# Patient Record
Sex: Female | Born: 1955 | Race: White | Hispanic: No | Smoking: Former smoker
Health system: Southern US, Community
[De-identification: ages and names within clinical notes are randomized; demographics above are authoritative.]

## PROBLEM LIST (undated history)

## (undated) DIAGNOSIS — Z9889 Other specified postprocedural states: Secondary | ICD-10-CM

## (undated) DIAGNOSIS — Z923 Personal history of irradiation: Secondary | ICD-10-CM

## (undated) DIAGNOSIS — F419 Anxiety disorder, unspecified: Secondary | ICD-10-CM

## (undated) DIAGNOSIS — C801 Malignant (primary) neoplasm, unspecified: Secondary | ICD-10-CM

## (undated) DIAGNOSIS — E039 Hypothyroidism, unspecified: Secondary | ICD-10-CM

## (undated) DIAGNOSIS — K219 Gastro-esophageal reflux disease without esophagitis: Secondary | ICD-10-CM

## (undated) DIAGNOSIS — M1990 Unspecified osteoarthritis, unspecified site: Secondary | ICD-10-CM

## (undated) DIAGNOSIS — C50919 Malignant neoplasm of unspecified site of unspecified female breast: Secondary | ICD-10-CM

## (undated) DIAGNOSIS — T7840XA Allergy, unspecified, initial encounter: Secondary | ICD-10-CM

## (undated) DIAGNOSIS — L409 Psoriasis, unspecified: Secondary | ICD-10-CM

## (undated) HISTORY — DX: Unspecified osteoarthritis, unspecified site: M19.90

## (undated) HISTORY — PX: TUBAL LIGATION: SHX77

## (undated) HISTORY — DX: Other specified postprocedural states: Z98.890

## (undated) HISTORY — PX: TONSILLECTOMY: SUR1361

## (undated) HISTORY — DX: Allergy, unspecified, initial encounter: T78.40XA

## (undated) HISTORY — DX: Hypothyroidism, unspecified: E03.9

## (undated) HISTORY — PX: COLONOSCOPY: SHX174

## (undated) HISTORY — DX: Gastro-esophageal reflux disease without esophagitis: K21.9

## (undated) HISTORY — PX: BREAST LUMPECTOMY: SHX2

## (undated) HISTORY — DX: Psoriasis, unspecified: L40.9

## (undated) HISTORY — PX: EYE SURGERY: SHX253

## (undated) HISTORY — DX: Malignant (primary) neoplasm, unspecified: C80.1

## (undated) HISTORY — DX: Personal history of irradiation: Z92.3

## (undated) HISTORY — PX: POLYPECTOMY: SHX149

---

## 1998-02-27 ENCOUNTER — Ambulatory Visit (HOSPITAL_COMMUNITY): Admission: RE | Admit: 1998-02-27 | Discharge: 1998-02-27 | Payer: Self-pay | Admitting: Obstetrics and Gynecology

## 1999-05-19 ENCOUNTER — Encounter: Admission: RE | Admit: 1999-05-19 | Discharge: 1999-05-19 | Payer: Self-pay | Admitting: Obstetrics and Gynecology

## 1999-05-19 ENCOUNTER — Encounter: Payer: Self-pay | Admitting: Obstetrics and Gynecology

## 2000-05-19 ENCOUNTER — Encounter: Admission: RE | Admit: 2000-05-19 | Discharge: 2000-05-19 | Payer: Self-pay | Admitting: Obstetrics and Gynecology

## 2000-05-19 ENCOUNTER — Encounter: Payer: Self-pay | Admitting: Obstetrics and Gynecology

## 2001-05-22 ENCOUNTER — Encounter: Admission: RE | Admit: 2001-05-22 | Discharge: 2001-05-22 | Payer: Self-pay | Admitting: Internal Medicine

## 2001-05-22 ENCOUNTER — Encounter: Payer: Self-pay | Admitting: Internal Medicine

## 2002-03-01 ENCOUNTER — Ambulatory Visit (HOSPITAL_BASED_OUTPATIENT_CLINIC_OR_DEPARTMENT_OTHER): Admission: RE | Admit: 2002-03-01 | Discharge: 2002-03-01 | Payer: Self-pay | Admitting: Obstetrics and Gynecology

## 2002-06-12 ENCOUNTER — Encounter: Admission: RE | Admit: 2002-06-12 | Discharge: 2002-06-12 | Payer: Self-pay | Admitting: Obstetrics and Gynecology

## 2002-06-12 ENCOUNTER — Encounter: Payer: Self-pay | Admitting: Obstetrics and Gynecology

## 2002-06-13 ENCOUNTER — Encounter: Admission: RE | Admit: 2002-06-13 | Discharge: 2002-06-13 | Payer: Self-pay | Admitting: Obstetrics and Gynecology

## 2002-06-13 ENCOUNTER — Encounter: Payer: Self-pay | Admitting: Obstetrics and Gynecology

## 2003-06-25 ENCOUNTER — Encounter: Admission: RE | Admit: 2003-06-25 | Discharge: 2003-06-25 | Payer: Self-pay | Admitting: Obstetrics and Gynecology

## 2004-06-30 ENCOUNTER — Encounter: Admission: RE | Admit: 2004-06-30 | Discharge: 2004-06-30 | Payer: Self-pay | Admitting: Obstetrics and Gynecology

## 2004-07-02 ENCOUNTER — Encounter: Admission: RE | Admit: 2004-07-02 | Discharge: 2004-07-02 | Payer: Self-pay | Admitting: Obstetrics and Gynecology

## 2004-10-05 ENCOUNTER — Ambulatory Visit: Payer: Self-pay | Admitting: Internal Medicine

## 2004-10-09 ENCOUNTER — Ambulatory Visit: Payer: Self-pay | Admitting: Internal Medicine

## 2004-12-30 ENCOUNTER — Ambulatory Visit: Payer: Self-pay | Admitting: Hematology & Oncology

## 2005-04-13 ENCOUNTER — Encounter: Admission: RE | Admit: 2005-04-13 | Discharge: 2005-04-13 | Payer: Self-pay | Admitting: Obstetrics and Gynecology

## 2005-07-13 ENCOUNTER — Other Ambulatory Visit: Admission: RE | Admit: 2005-07-13 | Discharge: 2005-07-13 | Payer: Self-pay | Admitting: Obstetrics and Gynecology

## 2006-04-14 ENCOUNTER — Encounter: Admission: RE | Admit: 2006-04-14 | Discharge: 2006-04-14 | Payer: Self-pay | Admitting: Internal Medicine

## 2006-07-18 ENCOUNTER — Other Ambulatory Visit: Admission: RE | Admit: 2006-07-18 | Discharge: 2006-07-18 | Payer: Self-pay | Admitting: Obstetrics and Gynecology

## 2007-04-20 ENCOUNTER — Encounter: Admission: RE | Admit: 2007-04-20 | Discharge: 2007-04-20 | Payer: Self-pay | Admitting: Internal Medicine

## 2007-08-17 ENCOUNTER — Other Ambulatory Visit: Admission: RE | Admit: 2007-08-17 | Discharge: 2007-08-17 | Payer: Self-pay | Admitting: Obstetrics and Gynecology

## 2008-04-22 ENCOUNTER — Encounter: Admission: RE | Admit: 2008-04-22 | Discharge: 2008-04-22 | Payer: Self-pay | Admitting: Internal Medicine

## 2008-07-29 ENCOUNTER — Other Ambulatory Visit: Admission: RE | Admit: 2008-07-29 | Discharge: 2008-07-29 | Payer: Self-pay | Admitting: Obstetrics and Gynecology

## 2008-12-11 ENCOUNTER — Encounter: Admission: RE | Admit: 2008-12-11 | Discharge: 2008-12-11 | Payer: Self-pay | Admitting: Neurosurgery

## 2009-04-24 ENCOUNTER — Encounter: Admission: RE | Admit: 2009-04-24 | Discharge: 2009-04-24 | Payer: Self-pay | Admitting: Internal Medicine

## 2009-05-06 ENCOUNTER — Encounter: Admission: RE | Admit: 2009-05-06 | Discharge: 2009-05-06 | Payer: Self-pay | Admitting: Internal Medicine

## 2009-10-28 ENCOUNTER — Encounter: Admission: RE | Admit: 2009-10-28 | Discharge: 2009-10-28 | Payer: Self-pay | Admitting: Internal Medicine

## 2010-04-13 ENCOUNTER — Encounter: Admission: RE | Admit: 2010-04-13 | Discharge: 2010-04-13 | Payer: Self-pay | Admitting: Internal Medicine

## 2010-08-03 ENCOUNTER — Other Ambulatory Visit: Payer: Self-pay | Admitting: Internal Medicine

## 2010-08-03 DIAGNOSIS — N6310 Unspecified lump in the right breast, unspecified quadrant: Secondary | ICD-10-CM

## 2010-10-26 ENCOUNTER — Other Ambulatory Visit (HOSPITAL_COMMUNITY)
Admission: RE | Admit: 2010-10-26 | Discharge: 2010-10-26 | Disposition: A | Discharge status: Home / Self Care | Payer: BC Managed Care – PPO | Source: Ambulatory Visit | Attending: Obstetrics and Gynecology | Admitting: Obstetrics and Gynecology

## 2010-10-26 ENCOUNTER — Other Ambulatory Visit: Payer: Self-pay | Admitting: Obstetrics and Gynecology

## 2010-10-26 DIAGNOSIS — Z01419 Encounter for gynecological examination (general) (routine) without abnormal findings: Secondary | ICD-10-CM | POA: Insufficient documentation

## 2010-11-20 NOTE — Op Note (Signed)
   TNAMEGREY, SCHLAUCH                        ACCOUNT NO.:  [id number]   MEDICAL RECORD NO.:  [id number]                   PATIENT TYPE:  AMB   LOCATION:  [facility]                                 FACILITY:  [facility]   PHYSICIAN:  Katherine Roan, M.D.               DATE OF BIRTH:  07/06/1955   DATE OF PROCEDURE:  03/01/2002  DATE OF DISCHARGE:  03/01/2002                                 OPERATIVE REPORT   PREOPERATIVE DIAGNOSIS:  Desires sterilization.   POSTOPERATIVE DIAGNOSIS:  Desires sterilization.  Small uterine fibroids.   OPERATION PERFORMED:  Laparoscopic tubal ligation.   DESCRIPTION OF PROCEDURE:  The patient was placed in the lithotomy position  and the patient then examined under anesthesia.  Found to have an irregular,  normal-sized uterus.  No adnexal masses were noted.  The liver was not  enlarged.  The bladder was emptied.  Preoperatively draped in the usual  fashion.  A transverse incision was made in the abdomen, and the abdomen was  distended with the Veress needle using aspiration infusion technique.  The  abdomen was distended with carbon dioxide to about 3 L, and then the trocar  was inserted into the abdomen.  Visualization of the uterus revealed a  fundal fibroid and two small lower segment seedling fibroids.  Both ovaries  were normal.  A second puncture was made in the midline, and the tubes were  then cauterized 2 cm lateral to the uterotubal junction and scissored.  Hemostasis was secure.  Gas was evacuated and the umbilical incision was  closed with 0 Vicryl and 4-0 PDS.  The lower incision was closed with 4-0  PDS.  The incisions were infiltrated with about 17 cc of 0.5% Marcaine with  epinephrine.  The patient tolerated the procedure well and was sent to the  recovery room in good condition.                                               Katherine Roan, M.D.    SDM/MEDQ  D:  03/01/2002  T:  03/03/2002  Job:  [id number]

## 2011-02-10 ENCOUNTER — Telehealth: Payer: Self-pay | Admitting: *Deleted

## 2011-02-10 NOTE — Telephone Encounter (Signed)
Called patient at home no answer.  Called her mobile and she states she will call and schedule.  She wasn't at a place where she could look at her schedule.

## 2011-04-12 ENCOUNTER — Other Ambulatory Visit: Payer: Self-pay | Admitting: Internal Medicine

## 2011-04-12 ENCOUNTER — Ambulatory Visit
Admission: RE | Admit: 2011-04-12 | Discharge: 2011-04-12 | Disposition: A | Discharge status: Home / Self Care | Payer: BC Managed Care – PPO | Source: Ambulatory Visit | Attending: Internal Medicine | Admitting: Internal Medicine

## 2011-04-12 DIAGNOSIS — N6310 Unspecified lump in the right breast, unspecified quadrant: Secondary | ICD-10-CM

## 2011-04-13 ENCOUNTER — Other Ambulatory Visit: Payer: Self-pay | Admitting: Internal Medicine

## 2011-04-13 DIAGNOSIS — C50911 Malignant neoplasm of unspecified site of right female breast: Secondary | ICD-10-CM

## 2011-04-16 ENCOUNTER — Ambulatory Visit
Admission: RE | Admit: 2011-04-16 | Discharge: 2011-04-16 | Disposition: A | Discharge status: Home / Self Care | Payer: BC Managed Care – PPO | Source: Ambulatory Visit | Attending: Internal Medicine | Admitting: Internal Medicine

## 2011-04-16 DIAGNOSIS — C50911 Malignant neoplasm of unspecified site of right female breast: Secondary | ICD-10-CM

## 2011-04-16 MED ORDER — GADOBENATE DIMEGLUMINE 529 MG/ML IV SOLN: 20.0000 mL | Freq: Once | INTRAVENOUS | Status: AC | PRN

## 2011-04-21 ENCOUNTER — Other Ambulatory Visit: Payer: Self-pay | Admitting: Oncology

## 2011-04-21 ENCOUNTER — Encounter (INDEPENDENT_AMBULATORY_CARE_PROVIDER_SITE_OTHER): Payer: Self-pay | Admitting: Surgery

## 2011-04-21 ENCOUNTER — Ambulatory Visit (HOSPITAL_BASED_OUTPATIENT_CLINIC_OR_DEPARTMENT_OTHER): Payer: BC Managed Care – PPO | Admitting: Surgery

## 2011-04-21 ENCOUNTER — Ambulatory Visit: Payer: BC Managed Care – PPO | Attending: Surgery | Admitting: Physical Therapy

## 2011-04-21 ENCOUNTER — Encounter (HOSPITAL_BASED_OUTPATIENT_CLINIC_OR_DEPARTMENT_OTHER): Payer: BC Managed Care – PPO | Admitting: Oncology

## 2011-04-21 VITALS — BP 133/65 | HR 67 | Temp 98.6°F | Resp 20 | Ht 64.0 in | Wt 220.9 lb

## 2011-04-21 DIAGNOSIS — C50919 Malignant neoplasm of unspecified site of unspecified female breast: Secondary | ICD-10-CM | POA: Insufficient documentation

## 2011-04-21 DIAGNOSIS — M25619 Stiffness of unspecified shoulder, not elsewhere classified: Secondary | ICD-10-CM | POA: Insufficient documentation

## 2011-04-21 DIAGNOSIS — C50911 Malignant neoplasm of unspecified site of right female breast: Secondary | ICD-10-CM

## 2011-04-21 DIAGNOSIS — C50419 Malignant neoplasm of upper-outer quadrant of unspecified female breast: Secondary | ICD-10-CM

## 2011-04-21 DIAGNOSIS — IMO0001 Reserved for inherently not codable concepts without codable children: Secondary | ICD-10-CM | POA: Insufficient documentation

## 2011-04-21 LAB — CBC WITH DIFFERENTIAL/PLATELET
BASO%: 0.2 % (ref 0.0–2.0)
Basophils Absolute: 0 10*3/uL (ref 0.0–0.1)
EOS%: 3.5 % (ref 0.0–7.0)
Eosinophils Absolute: 0.3 10*3/uL (ref 0.0–0.5)
HCT: 39.7 % (ref 34.8–46.6)
HGB: 13.6 g/dL (ref 11.6–15.9)
LYMPH%: 23.9 % (ref 14.0–49.7)
MCH: 30.6 pg (ref 25.1–34.0)
MCHC: 34.4 g/dL (ref 31.5–36.0)
MCV: 89.1 fL (ref 79.5–101.0)
MONO#: 0.8 10*3/uL (ref 0.1–0.9)
MONO%: 8.5 % (ref 0.0–14.0)
NEUT#: 6 10*3/uL (ref 1.5–6.5)
NEUT%: 63.9 % (ref 38.4–76.8)
Platelets: 276 10*3/uL (ref 145–400)
RBC: 4.45 10*6/uL (ref 3.70–5.45)
RDW: 12.7 % (ref 11.2–14.5)
WBC: 9.4 10*3/uL (ref 3.9–10.3)
lymph#: 2.3 10*3/uL (ref 0.9–3.3)

## 2011-04-21 LAB — COMPREHENSIVE METABOLIC PANEL
ALT: 15 U/L (ref 0–35)
AST: 18 U/L (ref 0–37)
Albumin: 3.4 g/dL — ABNORMAL LOW (ref 3.5–5.2)
BUN: 15 mg/dL (ref 6–23)
Calcium: 9.5 mg/dL (ref 8.4–10.5)
Chloride: 100 mEq/L (ref 96–112)
Potassium: 4.3 mEq/L (ref 3.5–5.3)
Total Protein: 7.1 g/dL (ref 6.0–8.3)

## 2011-04-21 NOTE — Progress Notes (Signed)
Chief Complaint  Patient presents with  . Breast Cancer    HPI Kristen Gibson is a 55 y.o. female.  This is a pleasant lady referred by Dr. Timothy Lasso for evaluation and a recent right breast mass.biopsy of this mass is confirmed invasive breast cancer. She has no other complaints. She denies any previous problems with her breasts or nipple discharge. HPI  Past Medical History  Diagnosis Date  . Arthritis     Past Surgical History  Procedure Date  . Tubal ligation   . Appendectomy     Family History  Problem Relation Age of Onset  . Cancer Mother   . Cancer Maternal Grandmother   . Cancer Paternal Grandmother     Social History History  Substance Use Topics  . Smoking status: Never Smoker   . Smokeless tobacco: Not on file  . Alcohol Use: No    No Known Allergies  No current outpatient prescriptions on file.    Review of Systems Review of Systems  Constitutional: Negative.   HENT: Negative.   Eyes: Negative.   Respiratory: Negative.   Cardiovascular: Negative.   Gastrointestinal: Negative.   Genitourinary: Negative.   Musculoskeletal: Negative.   Neurological: Negative.   Hematological: Negative.   Psychiatric/Behavioral: Negative.     Blood pressure 133/65, pulse 67, temperature 98.6 F (37 C), resp. rate 20, height 5\' 4"  (1.626 m), weight 220 lb 14.4 oz (100.2 kg).  Physical Exam Physical Exam  Constitutional: She is oriented to person, place, and time. She appears well-developed and well-nourished. No distress.  HENT:  Head: Normocephalic and atraumatic.  Right Ear: External ear normal.  Left Ear: External ear normal.  Nose: Nose normal.  Mouth/Throat: Oropharynx is clear and moist. No oropharyngeal exudate.  Eyes: Conjunctivae are normal. Pupils are equal, round, and reactive to light. No scleral icterus.  Neck: Normal range of motion. Neck supple. No tracheal deviation present. No thyromegaly present.  Cardiovascular: Normal rate, regular rhythm,  normal heart sounds and intact distal pulses.   No murmur heard. Pulmonary/Chest: Effort normal and breath sounds normal. No respiratory distress. She has no wheezes.  Abdominal: Soft. Bowel sounds are normal. She exhibits no distension. There is no tenderness.  Musculoskeletal: Normal range of motion. She exhibits no edema and no tenderness.  Lymphadenopathy:    She has no cervical adenopathy.    She has no axillary adenopathy.       Right axillary: No lateral adenopathy present.       Left axillary: No lateral adenopathy present. Neurological: She is alert and oriented to person, place, and time.  Skin: Skin is warm and dry. No rash noted. No erythema.  Psychiatric: Her behavior is normal. Judgment normal.  there are no palpable masses in either breast other than a hematoma from previous biopsy of the right breast.  Data Reviewed The patient's MRI, mammogram, and biopsy results were reviewed.  Assessment    Patient with invasive right breast cancer    Plan    After discussion in the clinic, needle localized lumpectomy and sentinel lymph node biopsy were recommended.  I discussed this with her in detail. She is eating to proceed with breast conservation. I discussed the surgical procedure in detail. I discussed the risks of surgery which includes but is not limited to bleeding, infection, injury to other structures, need for further surgery should the nodes be positive for margins positive, et Karie Soda. Likely the success. She wished to proceed with surgery  Cait Locust A 04/21/2011, 10:35 AM    and

## 2011-04-23 ENCOUNTER — Other Ambulatory Visit (INDEPENDENT_AMBULATORY_CARE_PROVIDER_SITE_OTHER): Payer: Self-pay | Admitting: Surgery

## 2011-04-23 DIAGNOSIS — C50919 Malignant neoplasm of unspecified site of unspecified female breast: Secondary | ICD-10-CM

## 2011-04-23 DIAGNOSIS — C50911 Malignant neoplasm of unspecified site of right female breast: Secondary | ICD-10-CM

## 2011-04-30 ENCOUNTER — Encounter (INDEPENDENT_AMBULATORY_CARE_PROVIDER_SITE_OTHER): Payer: BC Managed Care – PPO | Admitting: Surgery

## 2011-05-06 DIAGNOSIS — C801 Malignant (primary) neoplasm, unspecified: Secondary | ICD-10-CM

## 2011-05-06 HISTORY — DX: Malignant (primary) neoplasm, unspecified: C80.1

## 2011-05-10 ENCOUNTER — Encounter (HOSPITAL_COMMUNITY): Payer: Self-pay

## 2011-05-10 ENCOUNTER — Other Ambulatory Visit (HOSPITAL_COMMUNITY): Payer: BC Managed Care – PPO

## 2011-05-10 ENCOUNTER — Encounter (HOSPITAL_COMMUNITY)
Admission: RE | Admit: 2011-05-10 | Discharge: 2011-05-10 | Disposition: A | Discharge status: Home / Self Care | Payer: BC Managed Care – PPO | Source: Ambulatory Visit | Attending: Surgery | Admitting: Surgery

## 2011-05-10 ENCOUNTER — Encounter (HOSPITAL_COMMUNITY): Payer: Self-pay | Admitting: Pharmacy Technician

## 2011-05-10 LAB — BASIC METABOLIC PANEL
BUN: 13 mg/dL (ref 6–23)
Calcium: 9.4 mg/dL (ref 8.4–10.5)
GFR calc Af Amer: >90 mL/min (ref >90)
GFR calc non Af Amer: >90 mL/min (ref >90)
Glucose, Bld: 93 mg/dL (ref 70–99)
Sodium: 140 mEq/L (ref 135–145)

## 2011-05-10 LAB — SURGICAL PCR SCREEN: Staphylococcus aureus: NEGATIVE

## 2011-05-10 LAB — CBC
Hemoglobin: 13.9 g/dL (ref 12.0–15.0)
MCH: 30.5 pg (ref 26.0–34.0)
MCHC: 34.5 g/dL (ref 30.0–36.0)
RDW: 12.8 % (ref 11.5–15.5)

## 2011-05-10 NOTE — Pre-Procedure Instructions (Signed)
20 Kristen Gibson  05/10/2011   Your procedure is scheduled on:  05/13/11  Report to [name] at 1145 AM.  Call this number if you have problems the morning of surgery: [phone]   Remember:   Do not eat food:After Midnight.  Do not drink clear liquids: 4 Hours before arrival.  Take these medicines the morning of surgery with A SIP OF WATER: synthroid,   Do not wear jewelry, make-up or nail polish.  Do not wear lotions, powders, or perfumes. You may wear deodorant.  Do not shave 48 hours prior to surgery.  Do not bring valuables to the hospital.  Contacts, dentures or bridgework may not be worn into surgery.  Leave suitcase in the car. After surgery it may be brought to your room.  For patients admitted to the hospital, checkout time is 11:00 AM the day of discharge.   Patients discharged the day of surgery will not be allowed to drive home.  Name and phone number of your driver: family  Special Instructions: CHG Shower Use Special Wash: 1/2 bottle night before surgery and 1/2 bottle morning of surgery.   Please read over the following fact sheets that you were given: MRSA Information

## 2011-05-12 MED ORDER — CEFAZOLIN SODIUM-DEXTROSE 2-3 GM-% IV SOLR
2.0000 g | INTRAVENOUS | Status: DC
  Filled 2011-05-12: qty 50

## 2011-05-13 ENCOUNTER — Ambulatory Visit (HOSPITAL_COMMUNITY): Payer: BC Managed Care – PPO | Admitting: Anesthesiology

## 2011-05-13 ENCOUNTER — Encounter: Payer: Self-pay | Admitting: Internal Medicine

## 2011-05-13 ENCOUNTER — Encounter (HOSPITAL_COMMUNITY): Payer: Self-pay | Admitting: Anesthesiology

## 2011-05-13 ENCOUNTER — Ambulatory Visit (HOSPITAL_COMMUNITY)
Admission: RE | Admit: 2011-05-13 | Discharge: 2011-05-13 | Disposition: A | Discharge status: Home / Self Care | Payer: BC Managed Care – PPO | Source: Ambulatory Visit | Attending: Surgery | Admitting: Surgery

## 2011-05-13 ENCOUNTER — Other Ambulatory Visit (INDEPENDENT_AMBULATORY_CARE_PROVIDER_SITE_OTHER): Payer: Self-pay | Admitting: Surgery

## 2011-05-13 ENCOUNTER — Ambulatory Visit
Admission: RE | Admit: 2011-05-13 | Discharge: 2011-05-13 | Disposition: A | Discharge status: Home / Self Care | Payer: BC Managed Care – PPO | Source: Ambulatory Visit | Attending: Surgery | Admitting: Surgery

## 2011-05-13 ENCOUNTER — Encounter (HOSPITAL_COMMUNITY): Payer: Self-pay | Admitting: Surgery

## 2011-05-13 ENCOUNTER — Other Ambulatory Visit (HOSPITAL_COMMUNITY): Payer: BC Managed Care – PPO

## 2011-05-13 ENCOUNTER — Encounter (HOSPITAL_COMMUNITY)
Admission: RE | Disposition: A | Discharge status: Home / Self Care | Payer: Self-pay | Source: Ambulatory Visit | Attending: Surgery

## 2011-05-13 DIAGNOSIS — C50919 Malignant neoplasm of unspecified site of unspecified female breast: Secondary | ICD-10-CM

## 2011-05-13 DIAGNOSIS — C50419 Malignant neoplasm of upper-outer quadrant of unspecified female breast: Secondary | ICD-10-CM | POA: Insufficient documentation

## 2011-05-13 DIAGNOSIS — Z01812 Encounter for preprocedural laboratory examination: Secondary | ICD-10-CM | POA: Insufficient documentation

## 2011-05-13 DIAGNOSIS — E039 Hypothyroidism, unspecified: Secondary | ICD-10-CM | POA: Insufficient documentation

## 2011-05-13 DIAGNOSIS — C50911 Malignant neoplasm of unspecified site of right female breast: Secondary | ICD-10-CM

## 2011-05-13 DIAGNOSIS — D0590 Unspecified type of carcinoma in situ of unspecified breast: Secondary | ICD-10-CM | POA: Insufficient documentation

## 2011-05-13 HISTORY — PX: BREAST SURGERY: SHX581

## 2011-05-13 SURGERY — BREAST LUMPECTOMY WITH SENTINEL LYMPH NODE BX
Anesthesia: General | Site: Breast | Laterality: Right | Wound class: Clean

## 2011-05-13 SURGERY — Surgical Case
Anesthesia: *Unknown

## 2011-05-13 MED ORDER — TECHNETIUM TC 99M SULFUR COLLOID FILTERED
1.0000 | Freq: Once | INTRAVENOUS | Status: AC | PRN
  Administered 2011-05-13: 1 via INTRADERMAL

## 2011-05-13 MED ORDER — SODIUM CHLORIDE 0.9 % IJ SOLN: 3.0000 mL | INTRAMUSCULAR | Status: DC | PRN

## 2011-05-13 MED ORDER — MIDAZOLAM HCL 2 MG/2ML IJ SOLN: 0.5000 mg | Freq: Once | INTRAMUSCULAR | Status: AC | PRN

## 2011-05-13 MED ORDER — HYDROCODONE-ACETAMINOPHEN 5-325 MG PO TABS: 1.0000 | ORAL_TABLET | Freq: Four times a day (QID) | ORAL | Status: AC | PRN

## 2011-05-13 MED ORDER — PROMETHAZINE HCL 25 MG/ML IJ SOLN: 6.2500 mg | INTRAMUSCULAR | Status: DC | PRN

## 2011-05-13 MED ORDER — FENTANYL CITRATE 0.05 MG/ML IJ SOLN
INTRAMUSCULAR | Status: DC | PRN
  Administered 2011-05-13 (×5): 50 ug via INTRAVENOUS

## 2011-05-13 MED ORDER — ACETAMINOPHEN 325 MG PO TABS: 650.0000 mg | ORAL_TABLET | ORAL | Status: DC | PRN

## 2011-05-13 MED ORDER — ACETAMINOPHEN 650 MG RE SUPP: 650.0000 mg | RECTAL | Status: DC | PRN

## 2011-05-13 MED ORDER — HYDROMORPHONE HCL PF 1 MG/ML IJ SOLN
0.2500 mg | INTRAMUSCULAR | Status: DC | PRN
  Administered 2011-05-13 (×2): 0.25 mg via INTRAVENOUS

## 2011-05-13 MED ORDER — OXYCODONE HCL 5 MG PO TABS: 5.0000 mg | ORAL_TABLET | ORAL | Status: DC | PRN

## 2011-05-13 MED ORDER — METHYLENE BLUE 1 % INJ SOLN
INTRAMUSCULAR | Status: DC | PRN
  Administered 2011-05-13: 5 mL

## 2011-05-13 MED ORDER — MEPERIDINE HCL 25 MG/ML IJ SOLN: 6.2500 mg | INTRAMUSCULAR | Status: DC | PRN

## 2011-05-13 MED ORDER — MORPHINE SULFATE 10 MG/ML IJ SOLN
INTRAMUSCULAR | Status: DC | PRN
  Administered 2011-05-13 (×2): 5 mg via INTRAVENOUS

## 2011-05-13 MED ORDER — DEXAMETHASONE SODIUM PHOSPHATE 4 MG/ML IJ SOLN
INTRAMUSCULAR | Status: DC | PRN
  Administered 2011-05-13: 4 mg via INTRAVENOUS

## 2011-05-13 MED ORDER — PROPOFOL 10 MG/ML IV EMUL
INTRAVENOUS | Status: DC | PRN
  Administered 2011-05-13: 160 mg via INTRAVENOUS

## 2011-05-13 MED ORDER — SODIUM CHLORIDE 0.9 % IR SOLN
Status: DC | PRN
  Administered 2011-05-13: 1

## 2011-05-13 MED ORDER — SODIUM CHLORIDE 0.9 % IJ SOLN: 3.0000 mL | Freq: Two times a day (BID) | INTRAMUSCULAR | Status: DC

## 2011-05-13 MED ORDER — ONDANSETRON HCL 4 MG/2ML IJ SOLN
INTRAMUSCULAR | Status: DC | PRN
  Administered 2011-05-13 (×2): 4 mg via INTRAVENOUS

## 2011-05-13 MED ORDER — MORPHINE SULFATE 2 MG/ML IJ SOLN: 2.0000 mg | INTRAMUSCULAR | Status: DC | PRN

## 2011-05-13 MED ORDER — BUPIVACAINE-EPINEPHRINE 0.25% -1:200000 IJ SOLN
INTRAMUSCULAR | Status: DC | PRN
  Administered 2011-05-13: 20 mL

## 2011-05-13 MED ORDER — MIDAZOLAM HCL 5 MG/5ML IJ SOLN
INTRAMUSCULAR | Status: DC | PRN
  Administered 2011-05-13: 2 mg via INTRAVENOUS

## 2011-05-13 MED ORDER — PROMETHAZINE HCL 25 MG/ML IJ SOLN: 12.5000 mg | Freq: Four times a day (QID) | INTRAMUSCULAR | Status: DC | PRN

## 2011-05-13 MED ORDER — SODIUM CHLORIDE 0.9 % IV SOLN: 250.0000 mL | INTRAVENOUS | Status: DC

## 2011-05-13 MED ORDER — MORPHINE SULFATE 2 MG/ML IJ SOLN: 0.0500 mg/kg | INTRAMUSCULAR | Status: DC | PRN

## 2011-05-13 MED ORDER — ONDANSETRON HCL 4 MG/2ML IJ SOLN: 4.0000 mg | Freq: Four times a day (QID) | INTRAMUSCULAR | Status: DC | PRN

## 2011-05-13 MED ORDER — LACTATED RINGERS IV SOLN
INTRAVENOUS | Status: DC | PRN
  Administered 2011-05-13 (×2): via INTRAVENOUS

## 2011-05-13 MED ORDER — KETOROLAC TROMETHAMINE 30 MG/ML IJ SOLN
INTRAMUSCULAR | Status: DC | PRN
  Administered 2011-05-13: 30 mg via INTRAVENOUS

## 2011-05-13 SURGICAL SUPPLY — 44 items
APPLIER CLIP 9.375 MED OPEN (MISCELLANEOUS) ×2
BENZOIN TINCTURE PRP APPL 2/3 (GAUZE/BANDAGES/DRESSINGS) ×2 IMPLANT
BLADE SURG 15 STRL LF DISP TIS (BLADE) ×1 IMPLANT
BLADE SURG 15 STRL SS (BLADE) ×1
CANISTER SUCTION 2500CC (MISCELLANEOUS) ×2 IMPLANT
CHLORAPREP W/TINT 26ML (MISCELLANEOUS) ×2 IMPLANT
CLIP APPLIE 9.375 MED OPEN (MISCELLANEOUS) ×1 IMPLANT
CLOTH BEACON ORANGE TIMEOUT ST (SAFETY) ×2 IMPLANT
CONT SPEC 4OZ CLIKSEAL STRL BL (MISCELLANEOUS) ×2 IMPLANT
COVER PROBE W GEL 5X96 (DRAPES) ×2 IMPLANT
COVER SURGICAL LIGHT HANDLE (MISCELLANEOUS) ×2 IMPLANT
DEVICE DUBIN SPECIMEN MAMMOGRA (MISCELLANEOUS) ×2 IMPLANT
DRAPE LAPAROSCOPIC ABDOMINAL (DRAPES) ×2 IMPLANT
DRSG TEGADERM 4X4.75 (GAUZE/BANDAGES/DRESSINGS) ×2 IMPLANT
ELECT CAUTERY BLADE 6.4 (BLADE) ×2 IMPLANT
ELECT REM PT RETURN 9FT ADLT (ELECTROSURGICAL) ×2
ELECTRODE REM PT RTRN 9FT ADLT (ELECTROSURGICAL) ×1 IMPLANT
GAUZE SPONGE 4X4 12PLY STRL LF (GAUZE/BANDAGES/DRESSINGS) ×2 IMPLANT
GLOVE SURG SIGNA 7.5 PF LTX (GLOVE) ×2 IMPLANT
GOWN PREVENTION PLUS XLARGE (GOWN DISPOSABLE) ×2 IMPLANT
GOWN STRL NON-REIN LRG LVL3 (GOWN DISPOSABLE) ×2 IMPLANT
KIT BASIN OR (CUSTOM PROCEDURE TRAY) ×2 IMPLANT
KIT MARKER MARGIN INK (KITS) ×2 IMPLANT
KIT ROOM TURNOVER OR (KITS) ×2 IMPLANT
NEEDLE 18GX1X1/2 (RX/OR ONLY) (NEEDLE) ×2 IMPLANT
NEEDLE HYPO 25GX1X1/2 BEV (NEEDLE) ×4 IMPLANT
NS IRRIG 1000ML POUR BTL (IV SOLUTION) ×2 IMPLANT
PACK SURGICAL SETUP 50X90 (CUSTOM PROCEDURE TRAY) ×2 IMPLANT
PAD ARMBOARD 7.5X6 YLW CONV (MISCELLANEOUS) ×2 IMPLANT
PENCIL BUTTON HOLSTER BLD 10FT (ELECTRODE) ×2 IMPLANT
SPONGE GAUZE 4X4 12PLY (GAUZE/BANDAGES/DRESSINGS) ×2 IMPLANT
SPONGE LAP 4X18 X RAY DECT (DISPOSABLE) ×2 IMPLANT
STRIP CLOSURE SKIN 1/2X4 (GAUZE/BANDAGES/DRESSINGS) ×2 IMPLANT
SUT MON AB 4-0 PC3 18 (SUTURE) ×4 IMPLANT
SUT SILK 2 0 SH (SUTURE) IMPLANT
SUT VIC AB 3-0 SH 27 (SUTURE) ×1
SUT VIC AB 3-0 SH 27XBRD (SUTURE) ×1 IMPLANT
SYR BULB 3OZ (MISCELLANEOUS) ×2 IMPLANT
SYR CONTROL 10ML LL (SYRINGE) ×4 IMPLANT
TOWEL OR 17X24 6PK STRL BLUE (TOWEL DISPOSABLE) ×2 IMPLANT
TOWEL OR 17X26 10 PK STRL BLUE (TOWEL DISPOSABLE) ×2 IMPLANT
TUBE CONNECTING 12X1/4 (SUCTIONS) ×2 IMPLANT
WATER STERILE IRR 1000ML POUR (IV SOLUTION) IMPLANT
YANKAUER SUCT BULB TIP NO VENT (SUCTIONS) ×2 IMPLANT

## 2011-05-13 NOTE — H&P (Signed)
Kristen Gibson   04/21/2011 9:00 AM Office Visit  MRN: [id number]   Description: 55 year old female  Provider: Shelly Rubenstein, MD  Department: [facility]        Diagnoses     Cancer of upper-outer quadrant of female breast   - Primary    174.4    right  T1b Nx Mx ER + PR + Her 2 Neg    Breast cancer, right breast     174.9      Reason for Visit     Breast Cancer        Vitals - Last Recorded       BP Pulse Temp Resp Ht Wt    133/65  67  98.6 F (37 C)  20  5\' 4"  (1.626 m)  220 lb 14.4 oz (100.2 kg)          BMI              37.92 kg/m2                 Progress Notes     Freemon Binford A, MD  04/21/2011 10:41 AM  SignedChief Complaint   Patient presents with   .  Breast Cancer      HPI Kristen Gibson is a 55 y.o. female.  This is a pleasant lady referred by Dr. Timothy Lasso for evaluation and a recent right breast mass.biopsy of this mass is confirmed invasive breast cancer. She has no other complaints. She denies any previous problems with her breasts or nipple discharge. HPI    Past Medical History   Diagnosis  Date   .  Arthritis         Past Surgical History   Procedure  Date   .  Tubal ligation     .  Appendectomy         Family History   Problem  Relation  Age of Onset   .  Cancer  Mother     .  Cancer  Maternal Grandmother     .  Cancer  Paternal Grandmother        Social History History   Substance Use Topics   .  Smoking status:  Never Smoker    .  Smokeless tobacco:  Not on file   .  Alcohol Use:  No      No Known Allergies    No current outpatient prescriptions on file.      Review of Systems Review of Systems  Constitutional: Negative.   HENT: Negative.   Eyes: Negative.   Respiratory: Negative.   Cardiovascular: Negative.   Gastrointestinal: Negative.   Genitourinary: Negative.   Musculoskeletal: Negative.   Neurological: Negative.   Hematological: Negative.   Psychiatric/Behavioral: Negative.      Blood pressure 133/65, pulse 67, temperature 98.6 F (37 C), resp. rate 20, height 5\' 4"  (1.626 m), weight 220 lb 14.4 oz (100.2 kg).   Physical Exam Physical Exam  Constitutional: She is oriented to person, place, and time. She appears well-developed and well-nourished. No distress.  HENT:   Head: Normocephalic and atraumatic.  Right Ear: External ear normal.  Left Ear: External ear normal.   Nose: Nose normal.   Mouth/Throat: Oropharynx is clear and moist. No oropharyngeal exudate.  Eyes: Conjunctivae are normal. Pupils are equal, round, and reactive to light. No scleral icterus.  Neck: Normal range of motion. Neck supple. No tracheal deviation present. No thyromegaly present.  Cardiovascular: Normal  rate, regular rhythm, normal heart sounds and intact distal pulses.    No murmur heard. Pulmonary/Chest: Effort normal and breath sounds normal. No respiratory distress. She has no wheezes.  Abdominal: Soft. Bowel sounds are normal. She exhibits no distension. There is no tenderness.  Musculoskeletal: Normal range of motion. She exhibits no edema and no tenderness.  Lymphadenopathy:    She has no cervical adenopathy.    She has no axillary adenopathy.       Right axillary: No lateral adenopathy present.       Left axillary: No lateral adenopathy present. Neurological: She is alert and oriented to person, place, and time.  Skin: Skin is warm and dry. No rash noted. No erythema.  Psychiatric: Her behavior is normal. Judgment normal.  there are no palpable masses in either breast other than a hematoma from previous biopsy of the right breast.   Data Reviewed The patient's MRI, mammogram, and biopsy results were reviewed.   Assessment Patient with invasive right breast cancer   Plan After discussion in the clinic, needle localized lumpectomy and sentinel lymph node biopsy were recommended.  I discussed this with her in detail. She is eating to proceed with breast  conservation. I discussed the surgical procedure in detail. I discussed the risks of surgery which includes but is not limited to bleeding, infection, injury to other structures, need for further surgery should the nodes be positive for margins positive, et Karie Soda. Likely the success. She wished to proceed with surgery       Sumer Moorehouse A 04/21/2011, 10:35 AM      and         Not recorded              Level of Service     PR OFFICE CONSULTATION,LEVEL III [16109]      Follow-up and Disposition     Return for patient to be scheduled for surgery.       Routing History Recorded        All Flowsheet Templates (all recorded)     Encounter Vitals Flowsheet    Custom Formula Data Flowsheet    Anthropometrics Flowsheet                      All Charges for This Encounter       Code Description Service Date Service Provider Modifiers Quantity    [phone] PR OFFICE CONSULTATION,LEVEL III 04/21/2011 Shelly Rubenstein, MD   1        Other Encounter Related Information     Allergies & Medications         Problem List         History         Patient-Entered Questionnaires     No data filed

## 2011-05-13 NOTE — Interval H&P Note (Signed)
History and Physical Interval Note:   05/13/2011   8:36 AM   Kristen Gibson  has presented today for surgery, with the diagnosis of right breast cancer   The various methods of treatment have been discussed with the patient and family. After consideration of risks, benefits and other options for treatment, the patient has consented to  Procedure(s): BREAST LUMPECTOMY WITH SENTINEL LYMPH NODE BX as a surgical intervention .  The patients' history has been reviewed, patient examined, no change in status, stable for surgery.  I have reviewed the patients' chart and labs.  Questions were answered to the patient's satisfaction.     Abigail Miyamoto A  MD

## 2011-05-13 NOTE — Op Note (Signed)
05/13/2011  2:27 PM  PATIENT:  Kristen Gibson  55 y.o. female  PRE-OPERATIVE DIAGNOSIS:  right breast cancer   POST-OPERATIVE DIAGNOSIS:  right breast cancer  PROCEDURE:  Procedure(s): BREAST LUMPECTOMY WITH SENTINEL LYMPH NODE BX  SURGEON:  Surgeon(s): Shelly Rubenstein, MD  PHYSICIAN ASSISTANT:   ASSISTANTS: none   ANESTHESIA:   local and general  EBL:  Total I/O In: 1000 [I.V.:1000] Out: 50 [Blood:50]  BLOOD ADMINISTERED:none  DRAINS: none   LOCAL MEDICATIONS USED:  MARCAINE 20CC  SPECIMEN:  Biopsy / Limited Resection  DISPOSITION OF SPECIMEN:  PATHOLOGY  COUNTS:  YES  TOURNIQUET:  * No tourniquets in log *  DICTATION: .Dragon Dictation The patient was brought to the operating room and identified as the correct patient. She had already been injected with radioisotope in the holding area. A localization wire had also been placed at the breast center. The patient was placed in a supine position on the operating table. General anesthesia was induced. I next injected blue dye underneath the right areola and nipple and thoroughly massaged the breast. Her right breast and axilla were then prepped and draped in the usual sterile fashion. An elliptical incision on the skin around the localization wire at the 9:00 position. I then performed a wide lumpectomy including all the breast tissue around the localization wire. The specimen was then removed and sent to pathology for evaluation after x-ray confirmed the suspicious area was located in the specimen. I next brought the neoprobe operative field. I identified an area of increased uptake in the right axilla. I made an incision in the axilla. I then carried the dissection down to the axillary tissue. 2 separate sentinel lymph nodes were identified with extensive dissection. Both nodes were hot but only one was blue. Once the nodes were removed no further sentinel nodes were identified. I then anesthetized the wounds further with  Marcaine. I irrigated both wounds with saline. I then closed both wounds with subcuticular 3-0 Vicryl sutures and running 4-0 Monocryl sutures. Steri-Strips and Band-Aids were then applied. The patient tolerated the procedure well. All counts were correct at the end of the procedure. The patient was extubated in the operating room and taken in a stable condition to the recovery room.  PLAN OF CARE: Discharge to home after PACU  PATIENT DISPOSITION:  PACU - hemodynamically stable.   Delay start of Pharmacological VTE agent (>24hrs) due to surgical blood loss or risk of bleeding:  {YES/NO/NOT APPLICABLE:20182

## 2011-05-13 NOTE — Transfer of Care (Signed)
Immediate Anesthesia Transfer of Care Note  Patient: Kristen Gibson  Procedure(s) Performed:  BREAST LUMPECTOMY WITH SENTINEL LYMPH NODE BX - Needle Localized Right Breast Lumpectomy /right Breast Sentinel Lymp Node Biopsy   Patient Location: PACU   Anesthesia Type: General  Level of Consciousness: oriented, sedated and patient cooperative  Airway & Oxygen Therapy: Patient Spontanous Breathing and Patient connected to nasal cannula oxygen  Post-op Assessment: Report given to PACU RN and Post -op Vital signs reviewed and stable  Post vital signs: Reviewed and stable  Complications: No apparent anesthesia complications

## 2011-05-13 NOTE — Anesthesia Preprocedure Evaluation (Addendum)
Anesthesia Evaluation  Patient identified by MRN, date of birth, ID band Patient awake    Reviewed: Allergy & Precautions, H&P , NPO status , Patient's Chart, lab work & pertinent test results  Airway Mallampati: II TM Distance: >3 FB Neck ROM: full    Dental  (+) Teeth Intact and Dental Advidsory Given   Pulmonary          Cardiovascular regular Normal    Neuro/Psych    GI/Hepatic   Endo/Other  Hypothyroidism   Renal/GU      Musculoskeletal   Abdominal   Peds  Hematology   Anesthesia Other Findings   Reproductive/Obstetrics                          Anesthesia Physical Anesthesia Plan  ASA: II  Anesthesia Plan: General   Post-op Pain Management:    Induction: Intravenous  Airway Management Planned: Oral ETT  Additional Equipment:   Intra-op Plan:   Post-operative Plan: Extubation in OR  Informed Consent: I have reviewed the patients History and Physical, chart, labs and discussed the procedure including the risks, benefits and alternatives for the proposed anesthesia with the patient or authorized representative who has indicated his/her understanding and acceptance.   Dental advisory given  Plan Discussed with: CRNA  Anesthesia Plan Comments:         Anesthesia Quick Evaluation

## 2011-05-13 NOTE — Anesthesia Postprocedure Evaluation (Signed)
  Anesthesia Post-op Note  Patient: Kristen Gibson  Procedure(s) Performed:  BREAST LUMPECTOMY WITH SENTINEL LYMPH NODE BX - Needle Localized Right Breast Lumpectomy /right Breast Sentinel Lymp Node Biopsy   Patient Location: PACU  Anesthesia Type: General  Level of Consciousness: awake  Airway and Oxygen Therapy: Patient Spontanous Breathing  Post-op Pain: mild  Post-op Assessment: Post-op Vital signs reviewed  Post-op Vital Signs: stable  Complications: No apparent anesthesia complications

## 2011-05-13 NOTE — Preoperative (Signed)
Beta Blockers   Reason not to administer Beta Blockers:Not Applicable 

## 2011-05-13 NOTE — Discharge Summary (Signed)
Pt. Was not on status board, therefore unable to use d/c button for disposition. Ticket called in this morning.

## 2011-05-17 ENCOUNTER — Encounter: Payer: Self-pay | Admitting: *Deleted

## 2011-05-17 ENCOUNTER — Encounter (INDEPENDENT_AMBULATORY_CARE_PROVIDER_SITE_OTHER): Payer: Self-pay | Admitting: Surgery

## 2011-05-17 ENCOUNTER — Telehealth (INDEPENDENT_AMBULATORY_CARE_PROVIDER_SITE_OTHER): Payer: Self-pay | Admitting: General Surgery

## 2011-05-17 NOTE — Progress Notes (Signed)
Mailed after appt letter to pt. 

## 2011-05-17 NOTE — Telephone Encounter (Signed)
Kristen Gibson called today wanting her path report, I told her that I called [name] and  that Dr Luisa Hart is doing more test on the pathology and once we get the path report we will give her a call

## 2011-05-24 ENCOUNTER — Ambulatory Visit (AMBULATORY_SURGERY_CENTER): Payer: BC Managed Care – PPO | Admitting: *Deleted

## 2011-05-24 ENCOUNTER — Encounter: Payer: Self-pay | Admitting: Internal Medicine

## 2011-05-24 VITALS — Ht 64.0 in | Wt 220.0 lb

## 2011-05-24 DIAGNOSIS — Z1211 Encounter for screening for malignant neoplasm of colon: Secondary | ICD-10-CM

## 2011-05-24 MED ORDER — PEG-KCL-NACL-NASULF-NA ASC-C 100 G PO SOLR: ORAL | Status: DC

## 2011-05-31 ENCOUNTER — Encounter (INDEPENDENT_AMBULATORY_CARE_PROVIDER_SITE_OTHER): Payer: Self-pay | Admitting: Surgery

## 2011-05-31 ENCOUNTER — Ambulatory Visit (INDEPENDENT_AMBULATORY_CARE_PROVIDER_SITE_OTHER): Payer: BC Managed Care – PPO | Admitting: Surgery

## 2011-05-31 VITALS — BP 128/76 | HR 64 | Temp 97.3°F | Resp 12 | Ht 64.0 in | Wt 217.6 lb

## 2011-05-31 DIAGNOSIS — Z09 Encounter for follow-up examination after completed treatment for conditions other than malignant neoplasm: Secondary | ICD-10-CM

## 2011-05-31 NOTE — Progress Notes (Signed)
Subjective:     Patient ID: Kristen Gibson, female   DOB: 09/03/1955, 55 y.o.   MRN: [id number]  HPI  She is here for her first postoperative visit status post right breast lumpectomy and sentinel node biopsy. She is doing well and has no complaints. Review of Systems     Objective:   Physical Exam On exam, her incisions are healing very well without evidence of infection  The final pathology showed the margins were negative and the sentinel nodes were also negative. I gave her a copy of her pathology report. Assessment:     Patient status post lumpectomy for invasive right breast cancer.    Plan:     She already has her appointment to see the medical oncologist and radiation oncologist. I will see her back in 6 months

## 2011-06-08 ENCOUNTER — Ambulatory Visit (HOSPITAL_BASED_OUTPATIENT_CLINIC_OR_DEPARTMENT_OTHER): Payer: BC Managed Care – PPO | Admitting: Oncology

## 2011-06-08 ENCOUNTER — Telehealth: Payer: Self-pay | Admitting: Oncology

## 2011-06-08 VITALS — BP 133/83 | HR 72 | Temp 97.8°F | Ht 64.0 in | Wt 219.2 lb

## 2011-06-08 DIAGNOSIS — C50419 Malignant neoplasm of upper-outer quadrant of unspecified female breast: Secondary | ICD-10-CM

## 2011-06-08 DIAGNOSIS — Z170 Estrogen receptor positive status [ER+]: Secondary | ICD-10-CM

## 2011-06-08 DIAGNOSIS — C50919 Malignant neoplasm of unspecified site of unspecified female breast: Secondary | ICD-10-CM

## 2011-06-08 NOTE — Patient Instructions (Addendum)
Breast Cancer, Early Stage, Surgery Choices Breast cancer is the most common cancer, except for skin cancer. It is the second most common cause of death, except for lung cancer, in women. The risk of a woman developing breast cancer is 12.5%. Breast cancer in women younger than 53 years old is rare. Most of these cancer cases have spread to the breast from another part of the body (metastatic). Finding out that you have breast cancer is an emotional shock and is difficult to understand, because it is an overwhelming, serious, and life-threatening disease. You will need to make important decisions about how you want it treated.  As a woman with early-stage breast cancer (DCIS or Stage I, IIA, IIB, IIIA, or IV) you may be able to choose which type of breast surgery to have. Your caregiver will help you understand what stage you are in. Often, your choices are:  Removal of the cancerous part(s) of the breast (breast-sparing surgery).   Lumpectomy.   Partial/Segmental mastectomy.   Removal of the whole breast (simple mastectomy). Research shows that women with early-stage breast cancer who have breast-sparing surgery along with radiation therapy live as long as those who have a mastectomy. Most women with breast cancer will lead long, healthy lives after treatment.   Reconstructive surgery. This type of surgery is to make the breast and nipple area as normal looking as it was before the mastectomy. It is usually done by a plastic surgeon at the same time as the mastectomy. There are several types of reconstructive surgery that should be discussed with your caregiver and plastic surgeon.   Lymph node surgery.   Sentinel (removing those lymph nodes in the armpit that breast cancer spreads to first).   Axillary lymph node dissection (removing all the lymph nodes in the armpit).  TREATMENT Treatment for breast cancer usually begins a few weeks after diagnosis. In these weeks, you should meet with a  surgeon, learn the facts about your surgery choices, treatment options, get a second opinion, and think about what is important to you.  Treatment choices include:  Surgery.   Radiation.   Chemotherapy.   Medicines, hormones.   Reconstructive breast surgery.   Any combination of the above treatments.  Choose which kind of surgery to have. If radiation, chemotherapy, or hormone therapy is considered, this also should be discussed before the surgery. Radical breast surgery is rarely performed now. Usually, lumpectomy, partial/segmental mastectomy, simple mastectomy in combination with radiation, chemotherapy, and/or hormone treatment is recommended. Clinical trial protocols (specific treatment plan with surgery and radiation, chemotherapy, and hormones) for breast cancer have been put in place in hospitals, universities, medical schools, and cancer centers all over the country. These patients are followed for several years to find out what treatment gives the best results for the protocol given, for the different stages of breast cancer.  Most women want to make this choice with help from their caregiver. After all, the kind of surgery you have will affect how you look and feel. But it is often hard to decide what to do. The more information you have, the better the decision you can make.  Talk to a surgeon about your breast cancer surgery choices. Find out what happens during surgery, types of problems that sometimes occur, and other kinds of treatment (if any) you will need after surgery. Be sure to ask a lot of questions and learn as much as you can. You may also wish to talk with family members, friends, or  others who have had breast cancer surgery, radiation therapy, chemotherapy, or hormone therapy.   After talking with a surgeon, you may want a second opinion. This means talking with another doctor or surgeon who might tell you about other treatment options. They may simply give you  information that can help you feel better about the choice you are making. Do not worry about hurting your surgeon's feelings. It is common practice to get a second opinion, and some insurance companies require it. Plus, it is better to get a second opinion than to worry that you have made the wrong choice.  STAGES OF BREAST CANCER Staging of breast cancer depends on the size of the tumor, if and how far it has spread, lymph node involvement in the armpits, and whether it has spread to other parts of the body. If you are unsure of the stage of your cancer, ask your caregiver. The following are the stages of breast cancer:  Stage 0: This means you either have DCIS or LCIS.   DCIS (Ductal Carcinoma in Situ) is very early breast cancer. It is often too small to form a lump. Your caregiver may refer to DCIS as noninvasive cancer.   LCIS (Lobular Carcinoma in Situ) is not cancer, but it may increase the chance that you will get breast cancer. Talk with your caregiver about treatment options, if you are diagnosed with LCIS.   The 5 year survival rate in this stage is almost 100%.   Stage I:   Your cancer is less than 1 inch across (2 centimeters) or about the size of a quarter. The cancer is only in the breast. It has not spread to lymph nodes or other parts of your body.   Stage IIA:   No cancer is found in your breast. However, cancer is found in the lymph nodes under your arm, or   Your cancer is 1 inch (2 centimeters) or smaller. It has spread to 3 lymph nodes or less (the lymph nodes under your arm), or   Your cancer is about 1-2 inches (2-5 centimeters). It has not spread to the lymph nodes under your arm.   Stage IIB:   Your cancer is about 1-2 inches (2-5 centimeters). It has spread to the lymph nodes under your arm, or   Your cancer is larger than 2 inches (5 centimeters). It has not spread to the lymph nodes under your arm.   The 5 year survival rate is 85 to 90%.   Stage IIIA:     No cancer is found in the breast. It is found in lymph nodes under your arm. The lymph nodes are attached to each other, or   Your cancer is 2 inches (5 centimeters) or smaller. It has spread to lymph nodes under your arm. The lymph nodes are attached to each other, or   Your cancer is larger than 2 inches (5 centimeters) and has spread to lymph nodes under your arm, and they are not attached to each other.   Your cancer is smaller than 2 inches (5 centimeters) and has spread to lymph nodes above your collarbone.   Inflammatory cancer of the breast (red rash, tender and swollen breasts) is in the stage III category.   The 5 year survival rate is 45 to 67%.   Stage IV:   Cancer cells have spread to other parts of the body.   The 5 year survival rate is about 20%.  ABOUT LYMPH NODES  Lymph nodes  are part of your body's immune system, which helps fight infection and disease. Lymph nodes are small, round, and clustered (like a bunch of grapes) throughout your body.   Axillary lymph nodes are in the area under your arm. Breast cancer may spread to these lymph nodes first, even when the tumor in the breast is small. This is why most surgeons take out some of these lymph nodes at the time of breast surgery.   Lymphedema is swelling caused by a buildup of lymph fluid. You may have this type of swelling in your arm, if your lymph nodes are taken out with surgery or damaged by radiation therapy.   Lymphedema can show up soon after surgery. The symptoms are often mild and last for a short time.   Lymphedema can show up months or even years after cancer treatment is over. Often, lymphedema develops after an insect bite, minor injury, or burn on the arm where your lymph nodes were removed. Sometimes, this can be painful. One way to reduce the swelling is to work with a caregiver who specializes in rehabilitation, or with a physical therapist.   Sentinel lymph node biopsy is surgery to remove as  few lymph nodes as possible from under the arm. The surgeon first injects a dye in the breast to see which lymph nodes the breast tumor drains into. Then, he or she removes these nodes to see if they have any cancer. If there is no cancer, the surgeon may leave the other lymph nodes in place. This surgery is fairly new and is being studied experimentally. Talk with your surgeon if you want to learn more.  COMPARE YOUR CHOICES  BREAST-SPARING SURGERY  Is this surgery right for me?   Breast-sparing surgery with radiation is a safe choice for most women who have early-stage breast cancer. This means that your cancer is DCIS or at Stage I, IIA, IIB, or IIIA.  What are the names of the different kinds of surgery?  Lumpectomy.   Partial mastectomy.   Breast-sparing surgery.   Segmental mastectomy.   Simple mastectomy.   Sentinel lymph node removal.   Axillary lymph node excision.  What doctors am I likely to see?  Oncologist.   Surgeon.   Radiation Oncologist.   Chemotherapy Oncologist.  What will my breast look like after surgery?  Your breast should look a lot like it did before surgery. But if your tumor is large, your breast may look different or smaller after breast-sparing surgery.  Will I have feeling in the area around my breast?  Yes. You should still have feeling in your breast, nipple, and areola (dark area around your nipple).  Will I have pain after the surgery?  You may have pain after surgery. Talk with your caregiver about ways to control this pain.  What other problems can I expect?  You may feel very tired after radiation therapy. You may get swelling in your arm (lymphedema).  Will I need more surgery?  Maybe. You may need more surgery to remove lymph nodes from under your arm. Also, if the surgeon does not remove all your cancer the first time, you may need more surgery.  What other types of treatment will I need?  You may need radiation therapy, given  almost every day for 5 to 8 weeks. You may also need chemotherapy, hormone therapy, or both.  Will insurance pay for my surgery?  Check with your insurance company to find out how much it pays for  breast cancer surgery and other needed treatments.  Will the type of surgery I have affect how long I live?  Women with early-stage breast cancer who have breast-sparing surgery followed by radiation live just as long as women who have a mastectomy. Most women with breast cancer will lead long, healthy lives after treatment.  What are the chances that my cancer will come back after surgery?  About 10% of women who have breast-sparing surgery along with radiation therapy get cancer in the same breast within 12 years. If this happens, you will need a mastectomy, but it will not affect how long you live.  MASTECTOMY SURGERY Is this surgery right for me?   Mastectomy is a safe choice for women who have early-stage breast cancer (DCIS, Stage I, IIA, IIB, or IIIA). You may need a mastectomy if:   You have small breasts and a large tumor.   You have cancer in more than one part of your breast.   The tumor is under the nipple.   You do not have access to radiation therapy.  What are the names of the different kinds of surgery?  Total mastectomy.   Modified radical mastectomy (not usually done now).   Double mastectomy.   Simple mastectomy.  What doctors am I likely to see?  Oncologist.   Surgeon.   Radiation Oncologist.   Chemotherapy Oncologist.  What will my breast look like after surgery?  Your breast and nipple will be removed. You will have a flat chest on the side of your body where the breast was removed.  Will I have feeling in the area around my breast?  Maybe. After surgery, you will have no feeling (numb) in your chest wall and maybe also under your arm. This numb feeling should go away in 1-2 years, but it will never feel like it did before. Also, the skin where your breast  was may feel tight.  Will I have pain after the surgery?  You may have pain after surgery. Talk with your caregiver about ways to control this pain.  What other problems can I expect?  You may have pain in your neck or back. You may feel out of balance, if you had large breasts and do not have reconstruction surgery. You may get swelling in your arm (lymphedema).  Will I need more surgery?  Maybe. You may need surgery to remove lymph nodes from under your arm. Also, if you have problems after your mastectomy, you may need to see your surgeon for treatment.  What other types of treatment will I need?  You may also need chemotherapy, hormone therapy, or radiation therapy. Some women get all 3 types of therapy or any combination of the 3.  Will insurance pay for my surgery?  Check with your insurance company to find out how much it pays for breast cancer surgery and other needed treatments.  Will the type of surgery I have affect how long I live?  Women with early-stage breast cancer who have a mastectomy live as long as women who have breast-sparing surgery followed by radiation therapy. Most women with breast cancer will lead long, healthy lives after treatment.  What are the chances that my cancer will come back after surgery?  About 5% of women who have a mastectomy will get cancer on the same side of their chest within 12 years.  MASTECTOMY AND BREAST RECONSTRUCTION SURGERY  Is this surgery right for me?  If you have a mastectomy, you might  also want breast reconstruction surgery.   You can choose to have reconstruction surgery at the same time as your mastectomy.   You can wait and have it at a later date.  What are the names of the different kinds of surgery?  Breast implant.   Tissue flap surgery.   Reconstructive plastic surgery.  What doctors am I likely to see?  Oncologist.   Surgeon.   Radiation Oncologist.   Reconstructive Plastic Surgeon.   Chemotherapy  Oncologist.  What will my breast look like after surgery?  Although you will have a breast-like shape, your breast will not look the same as it did before surgery.  Will I have feeling in the area around my breast?  No. The area around your breast will always be numb (have no feeling).  Will I have pain after the surgery?  You are likely to have pain after major surgery, such as mastectomy and reconstruction surgery. There are many ways to deal with pain. Let your caregiver know if you need relief from pain.  What other problems can I expect?  It may take you many weeks or even months to recover from breast reconstruction surgery. If you have an implant, you may get infections, pain, or hardness. Also, you may not like how your breast-like shape looks. You may need more surgery if your implant breaks or leaks. If you have tissue flap surgery, you may lose strength in the part of your body where the flap came from. You may get swelling in your arm (lymphedema).  Will I need more surgery?  Yes. You will need surgery at least 2 more times to build a new breast-like shape. With implants, you may need more surgery months or years later. You may also need surgery to remove lymph nodes from under your arm.  What other types of treatment will I need?  You may need chemotherapy, hormone therapy, or radiation therapy. Some women get all 3 types of therapy or any combination of the 3.  Will insurance pay for my surgery?  Check with your insurance company to find out if it pays for breast reconstruction surgery. You should also ask if your insurance will pay for problems that may result from breast reconstruction surgery.  Will the type of surgery I have affect how long I live?  Women with early-stage breast cancer who have a mastectomy live the same amount of time as women who have breast-sparing surgery followed by radiation therapy. Most women with breast cancer will lead long, healthy lives after  treatment.  What are the chances that my cancer will come back after surgery?  About 5% of women who have a mastectomy will get cancer on the same side of their chest within 12 years. Breast reconstruction surgery does not affect the chances of your cancer coming back.  Where can I learn more about coping with life after cancer? To learn more about life after cancer, you might want to read "Facing Forward: Life After Cancer Treatment." You can get this booklet at ReviewTheMovie.co.za or by calling 1-800-4-CANCER. THINK ABOUT WHAT IS IMPORTANT TO YOU   After you have talked with your surgeon and learned the facts, you may also want to talk with your spouse or partner, family, friends, or other women who have had breast cancer surgery. The more you know about breast cancer, the treatment, and what happens afterward, the more informed you will be and the easier it will be for you to make good decisions  that are important to you.   Think about what is important to you. Here are some questions to think about:   Do I want to get a second opinion?   How important is it to me how my breast looks after cancer surgery?   How important is it to me how my breast feels after cancer surgery?   If I have breast-sparing surgery or more extensive surgery, am I willing to get radiation, hormone and/or chemotherapy?   If I have a mastectomy, do I also want breast reconstruction surgery?   If I have breast reconstruction surgery, do I want it at the same time as my mastectomy?   What treatment does my insurance cover, and what do I need to pay for?   Who would I like to talk with about my surgery, radiation, hormone, and chemotherapy choices?   What else do I want to know, do, or learn before I make my choice about breast cancer surgery?   After I have learned all I could and have talked with my surgeon, I will make a choice that feels right for me.   A patient who takes the time to be  well-informed will feel more comfortable about her decisions regarding surgery, and will feel better about the procedure following the surgery.   Coping and Support:   Be open and willing to talk to your family and friends about your cancer. Family and friends can be your best support group, to help you work through your concerns and worries.   Discussing your thoughts, concerns, and intimacy issues with your spouse or partner is very important and helpful.   Join support groups to share and learn how others with breast cancer cope and deal with their cancer. The [employer] can help you find support groups in your area.   Breast cancer may affect your confidence and feelings about being a total woman. It may interfere with your intimate relationship with your partner. Counseling may be necessary to help you overcome these feelings.   Talk to a counselor, your clergyperson, psychologist, or psychiatrist.   Talk to a medical social worker, if you have financial questions or problems.   Do not be afraid to ask for help, especially during your treatment.   Learn to be as independent as possible, as soon as possible.  Document Released: 09/11/2003 Document Revised: 03/03/2011 Document Reviewed: 05/19/2009 [facility] Patient Information 2012 [place], [place].  Tamoxifen oral tablet What is this medicine? TAMOXIFEN (ta MOX i fen) blocks the effects of estrogen. It is commonly used to treat breast cancer. It is also used to decrease the chance of breast cancer coming back in women who have received treatment for the disease. It may also help prevent breast cancer in women who have a high risk of developing breast cancer. This medicine may be used for other purposes; ask your health care provider or pharmacist if you have questions. What should I tell my health care provider before I take this medicine? They need to know if you have any of these conditions: -blood clots -blood  disease -cataracts or impaired eyesight -endometriosis -high calcium levels -high cholesterol -irregular menstrual cycles -liver disease -stroke -uterine fibroids -an unusual or allergic reaction to tamoxifen, other medicines, foods, dyes, or preservatives -pregnant or trying to get pregnant -breast-feeding How should I use this medicine? Take this medicine by mouth with a glass of water. Follow the directions on the prescription label. You can take it with or  without food. Take your medicine at regular intervals. Do not take your medicine more often than directed. Do not stop taking except on your doctor's advice. A special MedGuide will be given to you by the pharmacist with each prescription and refill. Be sure to read this information carefully each time. Talk to your pediatrician regarding the use of this medicine in children. While this drug may be prescribed for selected conditions, precautions do apply. Overdosage: If you think you have taken too much of this medicine contact a poison control center or emergency room at once. NOTE: This medicine is only for you. Do not share this medicine with others. What if I miss a dose? If you miss a dose, take it as soon as you can. If it is almost time for your next dose, take only that dose. Do not take double or extra doses. What may interact with this medicine? -aminoglutethimide -bromocriptine -chemotherapy drugs -female hormones, like estrogens and birth control pills -letrozole -medroxyprogesterone -phenobarbital -rifampin -warfarin This list may not describe all possible interactions. Give your health care provider a list of all the medicines, herbs, non-prescription drugs, or dietary supplements you use. Also tell them if you smoke, drink alcohol, or use illegal drugs. Some items may interact with your medicine. What should I watch for while using this medicine? Visit your doctor or health care professional for regular checks on  your progress. You will need regular pelvic exams, breast exams, and mammograms. If you are taking this medicine to reduce your risk of getting breast cancer, you should know that this medicine does not prevent all types of breast cancer. If breast cancer or other problems occur, there is no guarantee that it will be found at an early stage. Do not become pregnant while taking this medicine or for 2 months after stopping this medicine. Stop taking this medicine if you get pregnant or think you are pregnant and contact your doctor. This medicine may harm your unborn baby. Women who can possibly become pregnant should use birth control methods that do not use hormones during tamoxifen treatment and for 2 months after therapy has stopped. Talk with your health care provider for birth control advice. Do not breast feed while taking this medicine. What side effects may I notice from receiving this medicine? Side effects that you should report to your doctor or health care professional as soon as possible: -changes in vision (blurred vision) -changes in your menstrual cycle -difficulty breathing or shortness of breath -difficulty walking or talking -new breast lumps -numbness -pelvic pain or pressure -redness, blistering, peeling or loosening of the skin, including inside the mouth -skin rash or itching (hives) -sudden chest pain -swelling of lips, face, or tongue -swelling, pain or tenderness in your calf or leg -unusual bruising or bleeding -vaginal discharge that is bloody, brown, or rust -weakness -yellowing of the whites of the eyes or skin Side effects that usually do not require medical attention (report to your doctor or health care professional if they continue or are bothersome): -fatigue -hair loss, although uncommon and is usually mild -headache -hot flashes -impotence (in men) -nausea, vomiting (mild) -vaginal discharge (white or clear) This list may not describe all possible side  effects. Call your doctor for medical advice about side effects. You may report side effects to FDA at 1-800-FDA-1088. Where should I keep my medicine? Keep out of the reach of children. Store at room temperature between 20 and 25 degrees C (68 and 77 degrees F). Protect from  light. Keep container tightly closed. Throw away any unused medicine after the expiration date. NOTE: This sheet is a summary. It may not cover all possible information. If you have questions about this medicine, talk to your doctor, pharmacist, or health care provider.  2012, [employer]. (03/07/2008 12:01:56 PM)  Lab Results  Component Value Date   WBC 9.4 05/10/2011   HGB 13.9 05/10/2011   HCT 40.3 05/10/2011   MCV 88.6 05/10/2011   PLT 294 05/10/2011     Chemistry      Component Value Date/Time   NA 140 05/10/2011 1003   K 4.5 05/10/2011 1003   CL 105 05/10/2011 1003   CO2 27 05/10/2011 1003   BUN 13 05/10/2011 1003   CREATININE 0.76 05/10/2011 1003      Component Value Date/Time   CALCIUM 9.4 05/10/2011 1003   ALKPHOS 101 04/21/2011 0910   AST 18 04/21/2011 0910   ALT 15 04/21/2011 0910   BILITOT 0.4 04/21/2011 0910

## 2011-06-08 NOTE — Progress Notes (Signed)
OFFICE PROGRESS NOTE    Kristen Pounds, MD, MD [address] [facility], [place]. 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 Patient is now status post right lumpectomy with sentinel node procedure. This was performed on 05/13/2011.  PRIOR THERAPY:  #1 patient is status post right breast lumpectomy with right axillary sentinel node biopsy performed on 05/13/2011. The final pathology revealed a 1.2 cm invasive ductal carcinoma with low grade ductal carcinoma in situ. 2 sentinel nodes were negative for metastatic disease. The tumor was estrogen receptor +69% progesterone receptor +84% proliferation marker 6% HER-2/neu not amplified.  CURRENT THERAPY: Patient will be seen by Dr. Lurline Hare for consideration of radiation therapy. A referral has been made.  INTERVAL HISTORY: Kristen Gibson 55 y.o. female returns for followup visit today. Overall she is doing well she did tolerate the surgery quite well without any significant complaints. She went on to have have her lumpectomy and sentinel node biopsy. Her final pathology is noted above. She has healed quite well without any problems. Today she denies any fevers chills night sweats headaches shortness of breath chest pains palpitations she has no myalgias or arthralgias. Remainder of the 10 point review of systems is negative.  MEDICAL HISTORY: Past Medical History  Diagnosis Date  . Arthritis   . Hypothyroid   . Cancer 05/2011    right breast    ALLERGIES:   has no known allergies.  MEDICATIONS:  Current Outpatient Prescriptions  Medication Sig Dispense Refill  . ascorbic acid (VITAMIN C) 500 MG tablet Take 500 mg by mouth daily.        . Cholecalciferol (VITAMIN D) 1000 UNITS capsule Take 1,000 Units by mouth daily.        . fish oil-omega-3 fatty acids 1000 MG capsule Take 1 g  by mouth daily.        Marland Kitchen levothyroxine (SYNTHROID, LEVOTHROID) 137 MCG tablet Take 137 mcg by mouth daily.        . minocycline (MINOCIN,DYNACIN) 100 MG capsule Take 100 mg by mouth daily as needed. For rosacea       . peg 3350 powder (MOVIPREP) 100 G SOLR MOVI PREP take as directed  1 kit  0  . POTASSIUM CHLORIDE PO Take 595 mg by mouth as needed. OTC/leg cramps      . vitamin E 400 UNIT capsule Take 400 Units by mouth daily.          SURGICAL HISTORY:  Past Surgical History  Procedure Date  . Tubal ligation   . Tonsillectomy   . Breast surgery 2012    Rt Breast Lump/cancer    REVIEW OF SYSTEMS:  Pertinent items are noted in HPI.   PHYSICAL EXAMINATION: General appearance: alert, cooperative and appears stated age Head: Normocephalic, without obvious abnormality, atraumatic Neck: no adenopathy, no carotid bruit, no JVD, supple, symmetrical, trachea midline and thyroid not enlarged, symmetric, no tenderness/mass/nodules Lymph nodes: Cervical, supraclavicular, and axillary nodes normal. Resp: clear to auscultation bilaterally and normal percussion bilaterally Back: symmetric, no curvature. ROM normal. No CVA tenderness. Cardio: regular rate and rhythm, S1, S2 normal, no murmur, click, rub or gallop and normal apical impulse GI: soft, non-tender; bowel sounds normal; no masses,  no organomegaly Extremities: extremities normal, atraumatic, no cyanosis or edema Neurologic: Alert and oriented X 3, normal strength and tone. Normal symmetric reflexes. Normal coordination and gait Bilateral breasts  are examined. Right breast reveals well-healed surgical scar in the outer lower quadrant of the breast. There is no nipple discharge never there is no nodularity no erythema. Left breast no masses or nipple discharge.  ECOG PERFORMANCE STATUS: 1 - Symptomatic but completely ambulatory  Blood pressure 133/83, pulse 72, temperature 97.8 F (36.6 C), temperature source Oral, height 5\' 4"  (1.626 m),  weight 219 lb 3.2 oz (99.428 kg), last menstrual period 10/08/2010.  LABORATORY DATA: Lab Results  Component Value Date   WBC 9.4 05/10/2011   HGB 13.9 05/10/2011   HCT 40.3 05/10/2011   MCV 88.6 05/10/2011   PLT 294 05/10/2011      Chemistry      Component Value Date/Time   NA 140 05/10/2011 1003   K 4.5 05/10/2011 1003   CL 105 05/10/2011 1003   CO2 27 05/10/2011 1003   BUN 13 05/10/2011 1003   CREATININE 0.76 05/10/2011 1003      Component Value Date/Time   CALCIUM 9.4 05/10/2011 1003   ALKPHOS 101 04/21/2011 0910   AST 18 04/21/2011 0910   ALT 15 04/21/2011 0910   BILITOT 0.4 04/21/2011 0910       RADIOGRAPHIC STUDIES:  Mm Breast Surgical Specimen  05/13/2011  *RADIOLOGY REPORT*  Clinical Data:  Preoperative needle localization for biopsy-proven right breast 9 o'clock location DCIS  NEEDLE LOCALIZATION WITH MAMMOGRAPHIC GUIDANCE AND SPECIMEN RADIOGRAPH  Patient presents for needle localization prior to lumpectomy.  I met with the patient and we discussed the procedure of needle localization including risks, benefits, and alternatives. Specifically, we discussed the risks of infection, bleeding, tissue injury, and inadequate sampling. Informed written consent was given.  Using mammographic guidance, sterile technique, 2% lidocaine and a 5 cm modified Kopans needle, the ribbon shaped clip was localized using a lateral to medial approach.  The films are marked for Dr. Magnus Ivan.  Specimen radiograph was performed at day Surgery, and confirms the intact hook wire and clip are present in the tissue sample.  The specimen is marked for pathology.  IMPRESSION: Needle localization right breast.  No apparent complications.  Original Report Authenticated By: Harrel Lemon, M.D.   Mm Breast Wire Localization Right  05/13/2011  *RADIOLOGY REPORT*  Clinical Data:  Preoperative needle localization for biopsy-proven right breast 9 o'clock location DCIS  NEEDLE LOCALIZATION WITH MAMMOGRAPHIC GUIDANCE  AND SPECIMEN RADIOGRAPH  Patient presents for needle localization prior to lumpectomy.  I met with the patient and we discussed the procedure of needle localization including risks, benefits, and alternatives. Specifically, we discussed the risks of infection, bleeding, tissue injury, and inadequate sampling. Informed written consent was given.  Using mammographic guidance, sterile technique, 2% lidocaine and a 5 cm modified Kopans needle, the ribbon shaped clip was localized using a lateral to medial approach.  The films are marked for Dr. Magnus Ivan.  Specimen radiograph was performed at day Surgery, and confirms the intact hook wire and clip are present in the tissue sample.  The specimen is marked for pathology.  IMPRESSION: Needle localization right breast.  No apparent complications.  Original Report Authenticated By: Harrel Lemon, M.D.    PATHOLOGY: 1.2 cm invasive ductal carcinoma with low grade ductal carcinoma in situ. The primary tumor was also low grade. 2 sentinel nodes were negative for metastatic disease tumor ER +69% PR +84% proliferation marker T6 percent HER-2/neu -2 sentinel nodes were negative for metastatic disease pathologic staging pT1c, pN0   ASSESSMENT: 55 year old female with stage I (T1 N0) low-grade invasive  ductal carcinoma measuring 1.2 cm ER positive PR positive HER-2/neu negative Ki 67 6%. Patient is now status post lumpectomy on with sentinel node biopsy on 05/13/2011. Postoperatively she is doing well. She will require radiation therapy and therefore I have made a referral to Dr. Lurline Hare whom she saw originally in the multidisciplinary breast clinic. Patient after her radiation will be placed on adjuvant antiestrogen therapy consisting of tamoxifen 20 mg daily. Risks and benefits of tamoxifen were discussed with the patient and she was given literature as well.   PLAN: Referred to Dr. Lurline Hare today for radiation. Patient will followup in 3 months time or  sooner if need arises at which point we will begin her on antiestrogen therapy.   All questions were answered. The patient knows to call the clinic with any problems, questions or concerns. We can certainly see the patient much sooner if necessary.  I spent 20 minutes counseling the patient face to face. The total time spent in the appointment was 30 minutes.    Drue Second, MD [facility] [phone] (beeper) [phone] (Office)  06/08/2011, 10:30 AM

## 2011-06-08 NOTE — Telephone Encounter (Signed)
gv pt appt for april2013.  scheduled appt with Dr. Michell Heinrich for 06/10/2011 @ 12:30pm

## 2011-06-09 ENCOUNTER — Encounter: Payer: Self-pay | Admitting: Internal Medicine

## 2011-06-09 ENCOUNTER — Ambulatory Visit (AMBULATORY_SURGERY_CENTER): Payer: BC Managed Care – PPO | Admitting: Internal Medicine

## 2011-06-09 VITALS — BP 128/71 | HR 61 | Temp 97.8°F | Resp 15 | Ht 64.0 in | Wt 220.0 lb

## 2011-06-09 DIAGNOSIS — Z1211 Encounter for screening for malignant neoplasm of colon: Secondary | ICD-10-CM

## 2011-06-09 DIAGNOSIS — D126 Benign neoplasm of colon, unspecified: Secondary | ICD-10-CM

## 2011-06-09 DIAGNOSIS — Z853 Personal history of malignant neoplasm of breast: Secondary | ICD-10-CM

## 2011-06-09 DIAGNOSIS — Z86010 Personal history of colonic polyps: Secondary | ICD-10-CM

## 2011-06-09 MED ORDER — SODIUM CHLORIDE 0.9 % IV SOLN: 500.0000 mL | INTRAVENOUS | Status: DC

## 2011-06-09 NOTE — Patient Instructions (Signed)
Please review your discharge instructions (blue and green sheets)  Await pathology  Resume your normal medications

## 2011-06-09 NOTE — Progress Notes (Signed)
Patient did not experience any of the following events: a burn prior to discharge; a fall within the facility; wrong site/side/patient/procedure/implant event; or a hospital transfer or hospital admission upon discharge from the facility. (G8907) Patient did not have preoperative order for IV antibiotic SSI prophylaxis. (G8918)  

## 2011-06-09 NOTE — Op Note (Signed)
[facility] [address] [place], [place]  [address]  COLONOSCOPY PROCEDURE REPORT  PATIENT:  Kristen Gibson, Kristen Gibson  MR#:  [id number] BIRTHDATE:  May 30, 1956, 55 yrs. old  GENDER:  female ENDOSCOPIST:  Wilhemina Bonito. Eda Keys, MD REF. BY:  Surveillance Program Recall, PROCEDURE DATE:  06/09/2011 PROCEDURE:  Colonoscopy with snare polypectomy x 3 ASA CLASS:  Class II INDICATIONS:  history of pre-cancerous (adenomatous) colon polyps, surveillance and high-risk screening ; index exam 10-2004 w/ TA MEDICATIONS:   Fentanyl 125 mcg IV, Versed 12 mg IV, Benadryl 25 mg IV, These medications were titrated to patient response per physician's verbal order  DESCRIPTION OF PROCEDURE:   After the risks benefits and alternatives of the procedure were thoroughly explained, informed consent was obtained.  Digital rectal exam was performed and revealed no abnormalities.   The LB CF-[id number] endoscope was introduced through the anus and advanced to the cecum, which was identified by both the appendix and ileocecal valve, without limitations.  The quality of the prep was excellent, using MoviPrep.  The instrument was then slowly withdrawn as the colon was fully examined. <<PROCEDUREIMAGES>>  FINDINGS:  Three polyps (6mm, 12mm,1mm)  were found in the cecum and snared without cautery. Retrieval was successful in the largest.  Otherwise normal colonoscopy without other polyps, masses, vascular ectasias, or inflammatory changes.   Retroflexed views in the rectum revealed no abnormalities.    The time to cecum = 3:35  minutes. The scope was then withdrawn in 10:13 minutes from the cecum and the procedure completed.  COMPLICATIONS:  None  ENDOSCOPIC IMPRESSION: 1) Three polyps in the cecum - removed 2) Otherwise normal colonoscopy  RECOMMENDATIONS: 1) Follow up colonoscopy in 5 years  ______________________________ Wilhemina Bonito. Eda Keys, MD  CC:  Creola Corn, MD;   The Patient  n. eSIGNED:    Wilhemina Bonito. Eda Keys at 06/09/2011 02:33 PM  Shawn Stall, [id number]

## 2011-06-10 ENCOUNTER — Telehealth: Payer: Self-pay | Admitting: *Deleted

## 2011-06-10 ENCOUNTER — Encounter: Payer: Self-pay | Admitting: Radiation Oncology

## 2011-06-10 ENCOUNTER — Ambulatory Visit
Admission: RE | Admit: 2011-06-10 | Discharge: 2011-06-10 | Disposition: A | Discharge status: Home / Self Care | Payer: BC Managed Care – PPO | Source: Ambulatory Visit | Attending: Radiation Oncology | Admitting: Radiation Oncology

## 2011-06-10 ENCOUNTER — Ambulatory Visit
Admission: RE | Admit: 2011-06-10 | Discharge: 2011-06-10 | Payer: BC Managed Care – PPO | Source: Ambulatory Visit | Attending: Radiation Oncology | Admitting: Radiation Oncology

## 2011-06-10 VITALS — BP 116/77 | HR 64 | Temp 98.2°F | Ht 64.0 in | Wt 220.1 lb

## 2011-06-10 DIAGNOSIS — L988 Other specified disorders of the skin and subcutaneous tissue: Secondary | ICD-10-CM | POA: Insufficient documentation

## 2011-06-10 DIAGNOSIS — C50911 Malignant neoplasm of unspecified site of right female breast: Secondary | ICD-10-CM

## 2011-06-10 DIAGNOSIS — Z79899 Other long term (current) drug therapy: Secondary | ICD-10-CM | POA: Insufficient documentation

## 2011-06-10 DIAGNOSIS — L719 Rosacea, unspecified: Secondary | ICD-10-CM | POA: Insufficient documentation

## 2011-06-10 DIAGNOSIS — C50919 Malignant neoplasm of unspecified site of unspecified female breast: Secondary | ICD-10-CM | POA: Insufficient documentation

## 2011-06-10 DIAGNOSIS — Z510 Encounter for antineoplastic radiation therapy: Secondary | ICD-10-CM | POA: Insufficient documentation

## 2011-06-10 DIAGNOSIS — Y842 Radiological procedure and radiotherapy as the cause of abnormal reaction of the patient, or of later complication, without mention of misadventure at the time of the procedure: Secondary | ICD-10-CM | POA: Insufficient documentation

## 2011-06-10 HISTORY — DX: Anxiety disorder, unspecified: F41.9

## 2011-06-10 HISTORY — DX: Malignant neoplasm of unspecified site of unspecified female breast: C50.919

## 2011-06-10 NOTE — Progress Notes (Signed)
Single No children/No pregnancies. 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 Started menstrual cycle age 55 or 49 No HRT

## 2011-06-10 NOTE — Progress Notes (Signed)
Met with patient to discuss RO billing. Pt had some financial concerns and was given and EPP & Medicaid appl to complete:  Dx: 174.9 Female Breast, NOS (excludes Skin of breast T-173.5) Rad Tx: Extrl Beam [phone])  Attending Rad: Dr. Michell Heinrich

## 2011-06-10 NOTE — Progress Notes (Signed)
Please see the Nurse Progress Note in the MD Initial Consult Encounter for this patient. 

## 2011-06-10 NOTE — Telephone Encounter (Signed)

## 2011-06-11 ENCOUNTER — Other Ambulatory Visit: Payer: Self-pay | Admitting: Oncology

## 2011-06-11 DIAGNOSIS — C50919 Malignant neoplasm of unspecified site of unspecified female breast: Secondary | ICD-10-CM

## 2011-06-11 NOTE — Progress Notes (Signed)
CC:   Abigail Miyamoto, M.D. [name], MD [place]. Yetta Barre, M.D. Drue Second, M.D.  DIAGNOSIS:  T1 N0 invasive ductal carcinoma of the right breast.  PREVIOUS INTERVENTIONS:  Lumpectomy and sentinel lymph node biopsy on 05/13/2011 revealing invasive ductal carcinoma with 0/2 lymph nodes positive.  INTERVAL HISTORY:  Kristen Gibson has undergone her lumpectomy.  She has recovered well from that.  Her pathology came back negative margins, although the superior margin was close.  She met with Dr. Welton Flakes and has seen Dr. Magnus Ivan and has been released to go back to work.  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  She is worried about losing her insurance and would like her treatment to start as soon as possible. Other than that, she is doing great.  PHYSICAL EXAMINATION:  Chest wall:  Her incision is well healed.  Her sentinel node incision looks good.  General:  She is in no distress sitting comfortably on the exam room table.  IMPRESSION:  T1 N0 invasive ductal carcinoma of the right breast status post lumpectomy with negative nodes.  RECOMMENDATIONS:  Ashritha looks great.  She has healed up nicely.  I think she is ready to proceed on with simulation.  We were able to arrange for simulation today and will start her treatments next week.    ______________________________ Lurline Hare, M.D. SW/MEDQ  D:  06/12/2011  T:  06/11/2011  Job:  [id number]

## 2011-06-11 NOTE — Progress Notes (Signed)
Name: Kristen Gibson   WUJ:.[id number]  Date:  06/10/2011  DOB: 07/25/1955  Status:outpatient    DIAGNOSIS: Breast cancer.  CONSENT VERIFIED: yes   SET UP: Patient is setup supine   IMMOBILIZATION:  The following immobilization was used:Custom Moldable Pillow, breast board.   NARRATIVE:The patient was brought to the CT Simulation planning suite.  Identity was confirmed.  All relevant records and images related to the planned course of therapy were reviewed.  Then, the patient was positioned in a stable reproducible clinical set-up for radiation therapy.  Wires were placed to delineate the clinical extent of breast tissue. A wire was placed on the scar as well.  CT images were obtained.  Skin markings were placed.  The CT images were loaded into the planning software where the target and avoidance structures were contoured.  The radiation prescription was entered and confirmed. The patient was discharged in stable condition and tolerated simulation well.    TREATMENT PLANNING NOTE:  Treatment planning then occurred. I have requested : MLC's, isodose plan, basic dose calculation

## 2011-06-16 ENCOUNTER — Ambulatory Visit
Admission: RE | Admit: 2011-06-16 | Discharge: 2011-06-16 | Disposition: A | Discharge status: Home / Self Care | Payer: BC Managed Care – PPO | Source: Ambulatory Visit | Attending: Radiation Oncology | Admitting: Radiation Oncology

## 2011-06-16 ENCOUNTER — Encounter: Payer: Self-pay | Admitting: Radiation Oncology

## 2011-06-16 DIAGNOSIS — C50911 Malignant neoplasm of unspecified site of right female breast: Secondary | ICD-10-CM

## 2011-06-16 NOTE — Progress Notes (Signed)
[facility] Radiation Oncology Simulation Verification Note   Name: BREN BORYS MRN: [id number]   Date: @T @  DOB: 10/08/1955  Status:outpatient   DIAGNOSIS:  1. Breast cancer, right breast     POSITION: Ms. Mossa was placed in the supine position on the treatment machine.  Isocenter and MLCS were reviewed and treatment was approved.  NARRATIVE: Patient tolerated simulation well.

## 2011-06-17 ENCOUNTER — Ambulatory Visit
Admission: RE | Admit: 2011-06-17 | Discharge: 2011-06-17 | Disposition: A | Discharge status: Home / Self Care | Payer: BC Managed Care – PPO | Source: Ambulatory Visit | Attending: Radiation Oncology | Admitting: Radiation Oncology

## 2011-06-18 ENCOUNTER — Ambulatory Visit
Admission: RE | Admit: 2011-06-18 | Discharge: 2011-06-18 | Disposition: A | Discharge status: Home / Self Care | Payer: BC Managed Care – PPO | Source: Ambulatory Visit | Attending: Radiation Oncology | Admitting: Radiation Oncology

## 2011-06-18 DIAGNOSIS — C50911 Malignant neoplasm of unspecified site of right female breast: Secondary | ICD-10-CM

## 2011-06-18 MED ORDER — RADIAPLEXRX EX GEL
Freq: Once | CUTANEOUS | Status: AC
Start: 2011-06-18 — End: 2011-06-18
  Administered 2011-06-18: 170 via TOPICAL

## 2011-06-18 MED ORDER — ALRA NON-METALLIC DEODORANT (RAD-ONC)
1.0000 "application " | Freq: Once | TOPICAL | Status: AC
  Administered 2011-06-18: 1 via TOPICAL

## 2011-06-21 ENCOUNTER — Ambulatory Visit
Admission: RE | Admit: 2011-06-21 | Discharge: 2011-06-21 | Disposition: A | Discharge status: Home / Self Care | Payer: BC Managed Care – PPO | Source: Ambulatory Visit | Attending: Radiation Oncology | Admitting: Radiation Oncology

## 2011-06-22 ENCOUNTER — Ambulatory Visit
Admission: RE | Admit: 2011-06-22 | Discharge: 2011-06-22 | Disposition: A | Discharge status: Home / Self Care | Payer: BC Managed Care – PPO | Source: Ambulatory Visit | Attending: Radiation Oncology | Admitting: Radiation Oncology

## 2011-06-22 DIAGNOSIS — C50911 Malignant neoplasm of unspecified site of right female breast: Secondary | ICD-10-CM

## 2011-06-22 NOTE — Progress Notes (Signed)
Completed 4 of 25 treatments to right breast. Skin is pink and tender. Pt. States she sunburns easily. Pain level 3 or 4. Using  radiaplex may need to switch to biafine by next week.

## 2011-06-22 NOTE — Progress Notes (Signed)
DIAGNOSIS:  Right breast cancer.  NARRATIVE:  Kristen Gibson is seen today for weekly assessment.  She has completed 4 of 33 planned treatments directed at the right breast area (720 cGy of a planned 6100 cGy).  The patient has noticed a little discomfort in the breast area since starting her treatment.  The patient denies any fatigue at this time.  PHYSICAL EXAMINATION:  The lungs are clear.  The heart has a regular rhythm and rate.  Examination the right breast area reveals some mild erythema in the central portion of the breast.  There is no obvious sign of infection in the breast.  The patient's skin is intact at this time.  IMPRESSION AND PLAN:  The patient is tolerating her treatments well at this time.  The patient's radiation fields are setting up accurately. The patient's radiation chart was checked today.  The plan is to continue with breast conservation therapy to a cumulative dose of 6100 cGy.    ______________________________ Billie Lade, Ph.D., M.D. JDK/MEDQ  D:  06/22/2011  T:  06/22/2011  Job:  [id number]

## 2011-06-23 ENCOUNTER — Ambulatory Visit
Admission: RE | Admit: 2011-06-23 | Discharge: 2011-06-23 | Disposition: A | Discharge status: Home / Self Care | Payer: BC Managed Care – PPO | Source: Ambulatory Visit | Attending: Radiation Oncology | Admitting: Radiation Oncology

## 2011-06-24 ENCOUNTER — Ambulatory Visit
Admission: RE | Admit: 2011-06-24 | Discharge: 2011-06-24 | Disposition: A | Discharge status: Home / Self Care | Payer: BC Managed Care – PPO | Source: Ambulatory Visit | Attending: Radiation Oncology | Admitting: Radiation Oncology

## 2011-06-25 ENCOUNTER — Ambulatory Visit
Admission: RE | Admit: 2011-06-25 | Discharge: 2011-06-25 | Disposition: A | Discharge status: Home / Self Care | Payer: BC Managed Care – PPO | Source: Ambulatory Visit | Attending: Radiation Oncology | Admitting: Radiation Oncology

## 2011-06-28 ENCOUNTER — Ambulatory Visit
Admission: RE | Admit: 2011-06-28 | Discharge: 2011-06-28 | Disposition: A | Discharge status: Home / Self Care | Payer: BC Managed Care – PPO | Source: Ambulatory Visit | Attending: Radiation Oncology | Admitting: Radiation Oncology

## 2011-06-30 ENCOUNTER — Encounter: Payer: Self-pay | Admitting: Radiation Oncology

## 2011-06-30 ENCOUNTER — Ambulatory Visit
Admission: RE | Admit: 2011-06-30 | Discharge: 2011-06-30 | Disposition: A | Discharge status: Home / Self Care | Payer: BC Managed Care – PPO | Source: Ambulatory Visit | Attending: Radiation Oncology | Admitting: Radiation Oncology

## 2011-06-30 DIAGNOSIS — C50911 Malignant neoplasm of unspecified site of right female breast: Secondary | ICD-10-CM

## 2011-06-30 MED ORDER — RADIAPLEXRX EX GEL
Freq: Once | CUTANEOUS | Status: AC
  Administered 2011-06-30: 13:00:00 via TOPICAL

## 2011-06-30 NOTE — Progress Notes (Signed)
Patient presents to the clinic today unaccompanied for under treat visit with Dr. Michell Heinrich. Patient is alert and oriented to person, place, and time. No distress noted. Steady gait noted. Pleasant affect noted. Hyperpigmentation of right breast noted. Provided patient with tube of Radiaplex and reinforced skin care. Patient verbalized understanding. Patient using minocycline once a day. Patient questions if she should continue this because of photosensitivity. Patient reports brief sharp shooting pain 11/2 on a scale of 0-10 in her nipple and scar on the right breast. All findings reported to Dr. Michell Heinrich.

## 2011-06-30 NOTE — Progress Notes (Signed)
Weekly Management Note Current Dose: 16.2  Gy  Projected Dose: 61 Gy   Narrative:  The patient presents for routine under treatment assessment.  CBCT/MVCT images/Port film x-rays were reviewed.  The chart was checked. Doing well. Insurance issues figured out. Taking minocycline for rosacea and states it becomes out of control without it. No breast related complaints.   Physical Findings: Weight: 220 lb 12.8 oz (100.154 kg). Slightly pink rt breast.  Impression:  The patient is tolerating radiation.  Plan:  Continue treatment as planned. Continue radiaplex for now.

## 2011-07-01 ENCOUNTER — Ambulatory Visit
Admission: RE | Admit: 2011-07-01 | Discharge: 2011-07-01 | Disposition: A | Discharge status: Home / Self Care | Payer: BC Managed Care – PPO | Source: Ambulatory Visit | Attending: Radiation Oncology | Admitting: Radiation Oncology

## 2011-07-02 ENCOUNTER — Ambulatory Visit
Admission: RE | Admit: 2011-07-02 | Discharge: 2011-07-02 | Disposition: A | Discharge status: Home / Self Care | Payer: BC Managed Care – PPO | Source: Ambulatory Visit | Attending: Radiation Oncology | Admitting: Radiation Oncology

## 2011-07-05 ENCOUNTER — Ambulatory Visit
Admission: RE | Admit: 2011-07-05 | Discharge: 2011-07-05 | Disposition: A | Discharge status: Home / Self Care | Payer: BC Managed Care – PPO | Source: Ambulatory Visit | Attending: Radiation Oncology | Admitting: Radiation Oncology

## 2011-07-07 ENCOUNTER — Ambulatory Visit
Admission: RE | Admit: 2011-07-07 | Discharge: 2011-07-07 | Disposition: A | Discharge status: Home / Self Care | Payer: BC Managed Care – PPO | Source: Ambulatory Visit | Attending: Radiation Oncology | Admitting: Radiation Oncology

## 2011-07-07 DIAGNOSIS — C50911 Malignant neoplasm of unspecified site of right female breast: Secondary | ICD-10-CM

## 2011-07-07 NOTE — Progress Notes (Signed)
Weekly Management Note Current Dose:  23.4 Gy  Projected Dose: 61 Gy   Narrative:  The patient presents for routine under treatment assessment.  CBCT/MVCT images/Port film x-rays were reviewed.  The chart was checked. Doing well. No complaints this AM.  Working full time.   Physical Findings: Weight:  . Unchanged. Inframammary folds clear.  Impression:  The patient is tolerating radiation.  Plan:  Continue treatment as planned. Continue biafene.  Pt instructed to keep inframammary folds dry.

## 2011-07-07 NOTE — Progress Notes (Signed)
Has completed 13/25 treatments to right breast. Has some mild discoloration (pink). Denies fatigue. Continues to use radiaplex  Gel.

## 2011-07-08 ENCOUNTER — Ambulatory Visit
Admission: RE | Admit: 2011-07-08 | Discharge: 2011-07-08 | Disposition: A | Discharge status: Home / Self Care | Payer: BC Managed Care – PPO | Source: Ambulatory Visit | Attending: Radiation Oncology | Admitting: Radiation Oncology

## 2011-07-09 ENCOUNTER — Ambulatory Visit
Admission: RE | Admit: 2011-07-09 | Discharge: 2011-07-09 | Disposition: A | Discharge status: Home / Self Care | Payer: BC Managed Care – PPO | Source: Ambulatory Visit | Attending: Radiation Oncology | Admitting: Radiation Oncology

## 2011-07-12 ENCOUNTER — Ambulatory Visit
Admission: RE | Admit: 2011-07-12 | Discharge: 2011-07-12 | Disposition: A | Discharge status: Home / Self Care | Payer: BC Managed Care – PPO | Source: Ambulatory Visit | Attending: Radiation Oncology | Admitting: Radiation Oncology

## 2011-07-13 ENCOUNTER — Ambulatory Visit
Admission: RE | Admit: 2011-07-13 | Discharge: 2011-07-13 | Disposition: A | Discharge status: Home / Self Care | Payer: BC Managed Care – PPO | Source: Ambulatory Visit | Attending: Radiation Oncology | Admitting: Radiation Oncology

## 2011-07-13 DIAGNOSIS — C50911 Malignant neoplasm of unspecified site of right female breast: Secondary | ICD-10-CM

## 2011-07-13 NOTE — Progress Notes (Signed)
Pt right breast slight erythema, occasional shooting pains throughout breast, pt feels" she is getting a cold, sinus drainage and coughing" not tqking anything yet, fatigued after working full time, goes to bed soon after getting home:,using radiaplex gel bid 8:16 AM

## 2011-07-13 NOTE — Progress Notes (Signed)
Weekly Management Note Current Dose: 30.6  Gy  Projected Dose: 61 Gy   Narrative:  The patient presents for routine under treatment assessment.  CBCT/MVCT images/Port film x-rays were reviewed.  The chart was checked. Doing well. Some erythema and soreness. Has a head cold. Fatigue at night.   Physical Findings: Weight: 219 lb (99.338 kg). Skin slightly pink. Inframammary folds clear.  Impression:  The patient is tolerating radiation.  Plan:  Continue treatment as planned. Continue radiaplex. OK to use hydrocortisone medially.

## 2011-07-14 ENCOUNTER — Ambulatory Visit
Admission: RE | Admit: 2011-07-14 | Discharge: 2011-07-14 | Disposition: A | Discharge status: Home / Self Care | Payer: BC Managed Care – PPO | Source: Ambulatory Visit | Attending: Radiation Oncology | Admitting: Radiation Oncology

## 2011-07-15 ENCOUNTER — Ambulatory Visit
Admission: RE | Admit: 2011-07-15 | Discharge: 2011-07-15 | Disposition: A | Discharge status: Home / Self Care | Payer: BC Managed Care – PPO | Source: Ambulatory Visit | Attending: Radiation Oncology | Admitting: Radiation Oncology

## 2011-07-16 ENCOUNTER — Encounter: Payer: Self-pay | Admitting: *Deleted

## 2011-07-16 ENCOUNTER — Ambulatory Visit
Admission: RE | Admit: 2011-07-16 | Discharge: 2011-07-16 | Disposition: A | Discharge status: Home / Self Care | Payer: BC Managed Care – PPO | Source: Ambulatory Visit | Attending: Radiation Oncology | Admitting: Radiation Oncology

## 2011-07-16 DIAGNOSIS — C50911 Malignant neoplasm of unspecified site of right female breast: Secondary | ICD-10-CM

## 2011-07-16 MED ORDER — RADIAPLEXRX EX GEL
Freq: Once | CUTANEOUS | Status: AC
  Administered 2011-07-16: 170 via TOPICAL

## 2011-07-16 NOTE — Progress Notes (Signed)
[facility] Psychosocial Distress Screening Clinical Social Work  Clinical Social Work was referred by distress screening protocol. The patient scored a 7 on the Psychosocial Distress Thermometer which indicates moderate distress. Clinical Social Worker spoke with the patient briefly to assess for distress and other psychosocial needs. Ms. Galster shares that she is "handling it" and is only now experiencing some mild physical side effects related to her radiation treatment. The patient had indicated during her SIM visit that her insurance coverage was a major cause of distress at that time; she now indicates she has worked with her employer and does not have concerns with her coverage.  Ms. Wentland shared she is still having trouble sleeping at night and has "a hard time getting comfortable".  Clinical Social Worker encouraged her to try using our relaxation CD to help calm her mind and body.  The patient was very interested in this resource and CSW requested to meet with the patient to further assess psychosocial/emotional needs as well as provide information [facility] support programs.  CSW and patient plan to meet 07/20/11 after MD appointment. Kathrin Penner, MSW, [facility] Clinical Social Worker [facility] [phone]

## 2011-07-19 ENCOUNTER — Ambulatory Visit
Admission: RE | Admit: 2011-07-19 | Discharge: 2011-07-19 | Disposition: A | Discharge status: Home / Self Care | Payer: BC Managed Care – PPO | Source: Ambulatory Visit | Attending: Radiation Oncology | Admitting: Radiation Oncology

## 2011-07-20 ENCOUNTER — Ambulatory Visit
Admission: RE | Admit: 2011-07-20 | Discharge: 2011-07-20 | Disposition: A | Discharge status: Home / Self Care | Payer: BC Managed Care – PPO | Source: Ambulatory Visit | Attending: Radiation Oncology | Admitting: Radiation Oncology

## 2011-07-20 ENCOUNTER — Encounter: Payer: Self-pay | Admitting: Radiation Oncology

## 2011-07-20 DIAGNOSIS — C50911 Malignant neoplasm of unspecified site of right female breast: Secondary | ICD-10-CM

## 2011-07-20 MED ORDER — BIAFINE EX EMUL
CUTANEOUS | Status: DC | PRN
  Administered 2011-07-20: 09:00:00 via TOPICAL

## 2011-07-20 NOTE — Progress Notes (Signed)
Pt states skin very sensitive in general; some itchiness above breast but pt has not used Cortisone at this time.

## 2011-07-20 NOTE — Progress Notes (Signed)
Weekly Management Note Current Dose: 7.2  Gy  Projected Dose: 61 Gy   Narrative:  The patient presents for routine under treatment assessment.  CBCT/MVCT images/Port film x-rays were reviewed.  The chart was checked. Irritation on medial chest. Using hydrocortisone with moderate relief. Some painful areas under breast.   Physical Findings: Weight: 220 lb 9.6 oz (100.064 kg). Dry desquamation over entire breast. Worse medially. Early moist desquamation in inframammary fold. Skin intact.  Impression:  The patient is tolerating radiation.  Plan:  Continue treatment as planned. Keep inframammary fold dry. Switch to biafene from radiaplex to rest of breast.

## 2011-07-20 NOTE — Progress Notes (Signed)
Encounter addended by: Amanda Pea, RN on: 07/20/2011 10:56 AM<BR>     Documentation filed: Inpatient MAR, Orders

## 2011-07-21 ENCOUNTER — Ambulatory Visit
Admission: RE | Admit: 2011-07-21 | Discharge: 2011-07-21 | Disposition: A | Discharge status: Home / Self Care | Payer: BC Managed Care – PPO | Source: Ambulatory Visit | Attending: Radiation Oncology | Admitting: Radiation Oncology

## 2011-07-21 ENCOUNTER — Telehealth: Payer: Self-pay | Admitting: *Deleted

## 2011-07-21 NOTE — Telephone Encounter (Signed)
patient confirmed over the phone the new date and time on 08-05-2011 starting at 11:00am

## 2011-07-22 ENCOUNTER — Ambulatory Visit
Admission: RE | Admit: 2011-07-22 | Discharge: 2011-07-22 | Disposition: A | Discharge status: Home / Self Care | Payer: BC Managed Care – PPO | Source: Ambulatory Visit | Attending: Radiation Oncology | Admitting: Radiation Oncology

## 2011-07-23 ENCOUNTER — Ambulatory Visit
Admission: RE | Admit: 2011-07-23 | Discharge: 2011-07-23 | Disposition: A | Discharge status: Home / Self Care | Payer: BC Managed Care – PPO | Source: Ambulatory Visit | Attending: Radiation Oncology | Admitting: Radiation Oncology

## 2011-07-26 ENCOUNTER — Ambulatory Visit
Admission: RE | Admit: 2011-07-26 | Discharge: 2011-07-26 | Disposition: A | Discharge status: Home / Self Care | Payer: BC Managed Care – PPO | Source: Ambulatory Visit | Attending: Radiation Oncology | Admitting: Radiation Oncology

## 2011-07-26 ENCOUNTER — Encounter: Payer: Self-pay | Admitting: Radiation Oncology

## 2011-07-26 NOTE — Progress Notes (Signed)
[facility] Radiation Oncology Simulation Verification Note   Name: Kristen Gibson MRN: [id number]   Date:   07/23/2011 DOB: [birth year]  Status:outpatient   DIAGNOSIS: Breast cancer  POSITION: Patient was placed in the supine position on the treatment machine for verification simulation of her boost. She has tolerated her initial fields very well. Jamas Lav and MLCS were reviewed and treatment was approved.  NARRATIVE: Patient tolerated simulation well.

## 2011-07-27 ENCOUNTER — Ambulatory Visit
Admission: RE | Admit: 2011-07-27 | Discharge: 2011-07-27 | Disposition: A | Discharge status: Home / Self Care | Payer: BC Managed Care – PPO | Source: Ambulatory Visit | Attending: Radiation Oncology | Admitting: Radiation Oncology

## 2011-07-27 ENCOUNTER — Encounter: Payer: Self-pay | Admitting: Radiation Oncology

## 2011-07-27 DIAGNOSIS — C50911 Malignant neoplasm of unspecified site of right female breast: Secondary | ICD-10-CM

## 2011-07-27 NOTE — Progress Notes (Signed)
Weekly Management Note Current Dose:  49 Gy  Projected Dose: 61 Gy   Narrative:  The patient presents for routine under treatment assessment.  CBCT/MVCT images/Port film x-rays were reviewed.  The chart was checked. Doing well. Dry desquamation under breast is painful and warm.  Working. Asked for clarification on status of her cancer.   Physical Findings: Weight: 219 lb 6.4 oz (99.519 kg). Skin under breast is dry but irritated.  No moist desquamation.   Impression:  The patient is tolerating radiation.  Plan:  Continue treatment as planned. Discussed cancer in remission and cure at 5 years. Add hydrogel pads for inframammary area.

## 2011-07-27 NOTE — Progress Notes (Signed)
Gave pt hydrogel pads to place under R breast for discomfort there. She is not taking any meds for this.

## 2011-07-28 ENCOUNTER — Ambulatory Visit
Admission: RE | Admit: 2011-07-28 | Discharge: 2011-07-28 | Disposition: A | Discharge status: Home / Self Care | Payer: BC Managed Care – PPO | Source: Ambulatory Visit | Attending: Radiation Oncology | Admitting: Radiation Oncology

## 2011-07-29 ENCOUNTER — Ambulatory Visit
Admission: RE | Admit: 2011-07-29 | Discharge: 2011-07-29 | Disposition: A | Discharge status: Home / Self Care | Payer: BC Managed Care – PPO | Source: Ambulatory Visit | Attending: Radiation Oncology | Admitting: Radiation Oncology

## 2011-07-30 ENCOUNTER — Ambulatory Visit
Admission: RE | Admit: 2011-07-30 | Discharge: 2011-07-30 | Disposition: A | Discharge status: Home / Self Care | Payer: BC Managed Care – PPO | Source: Ambulatory Visit | Attending: Radiation Oncology | Admitting: Radiation Oncology

## 2011-08-02 ENCOUNTER — Ambulatory Visit
Admission: RE | Admit: 2011-08-02 | Discharge: 2011-08-02 | Disposition: A | Discharge status: Home / Self Care | Payer: BC Managed Care – PPO | Source: Ambulatory Visit | Attending: Radiation Oncology | Admitting: Radiation Oncology

## 2011-08-03 ENCOUNTER — Ambulatory Visit
Admission: RE | Admit: 2011-08-03 | Discharge: 2011-08-03 | Disposition: A | Discharge status: Home / Self Care | Payer: BC Managed Care – PPO | Source: Ambulatory Visit | Attending: Radiation Oncology | Admitting: Radiation Oncology

## 2011-08-03 ENCOUNTER — Encounter: Payer: Self-pay | Admitting: Radiation Oncology

## 2011-08-03 ENCOUNTER — Telehealth: Payer: Self-pay | Admitting: Oncology

## 2011-08-03 DIAGNOSIS — C50911 Malignant neoplasm of unspecified site of right female breast: Secondary | ICD-10-CM

## 2011-08-03 NOTE — Progress Notes (Signed)
Weekly Management Note Current Dose: 59  Gy  Projected Dose: 61 Gy   Narrative:  The patient presents for routine under treatment assessment.  CBCT/MVCT images/Port film x-rays were reviewed.  The chart was checked. Dry desquamation under right breast. Small (<2cm open area under breast). Excited to be done.  Requests change of oncologist.   Physical Findings: Weight: 218 lb 14.4 oz (99.292 kg). Dry desquamation under right breast. Small open area. No signs of infection.   Impression:  The patient is tolerating radiation.  Plan:  Continue treatment as planned. F/u in 1 month. Emailed Kaylyn Lim re: changing appointment.

## 2011-08-03 NOTE — Progress Notes (Signed)
Pt has 1 more treatment, pt has bright erythema right breast,peeling dry desquamation under fold of breast, 1 more tx, gave flyer on FYYN, and discussed using vit e lotion in a couple weeks, keep using biafine cream till then, pt gave verbal understanding, some pain at times 8:26 AM

## 2011-08-03 NOTE — Telephone Encounter (Signed)
Per 1/28 staff message from [facility] pt's 1/31 appt was moved from [facility] to [facility]. When i spoke with pt about the change she asked that the appt be cx'd and she does not wish to r/s @ this time.

## 2011-08-04 ENCOUNTER — Ambulatory Visit
Admission: RE | Admit: 2011-08-04 | Discharge: 2011-08-04 | Disposition: A | Discharge status: Home / Self Care | Payer: BC Managed Care – PPO | Source: Ambulatory Visit | Attending: Radiation Oncology | Admitting: Radiation Oncology

## 2011-08-05 ENCOUNTER — Other Ambulatory Visit: Payer: BC Managed Care – PPO | Admitting: Lab

## 2011-08-05 ENCOUNTER — Telehealth: Payer: Self-pay | Admitting: Oncology

## 2011-08-05 ENCOUNTER — Ambulatory Visit: Payer: BC Managed Care – PPO | Admitting: Oncology

## 2011-08-05 ENCOUNTER — Ambulatory Visit: Payer: BC Managed Care – PPO | Admitting: Family

## 2011-08-05 NOTE — Telephone Encounter (Signed)
I left a message for the patient at Dr. Luciano Cutter request.  The patient expressed frustration to her that her follow up appointment with Dr. Welton Flakes has been rescheduled and she is frustrated- and would like to see Dr. Darnelle Catalan.  I left a message to discuss with her as well as contacting the physicians requesting a change.

## 2011-08-06 ENCOUNTER — Other Ambulatory Visit: Payer: Self-pay | Admitting: Oncology

## 2011-08-06 DIAGNOSIS — C50919 Malignant neoplasm of unspecified site of unspecified female breast: Secondary | ICD-10-CM

## 2011-08-09 ENCOUNTER — Telehealth: Payer: Self-pay | Admitting: *Deleted

## 2011-08-09 NOTE — Telephone Encounter (Signed)
left message to inform the patient of the appointment on 08-19-2011 at 4:30pm

## 2011-08-10 ENCOUNTER — Other Ambulatory Visit (HOSPITAL_BASED_OUTPATIENT_CLINIC_OR_DEPARTMENT_OTHER): Payer: BC Managed Care – PPO | Admitting: Lab

## 2011-08-10 DIAGNOSIS — C50919 Malignant neoplasm of unspecified site of unspecified female breast: Secondary | ICD-10-CM

## 2011-08-10 LAB — COMPREHENSIVE METABOLIC PANEL
AST: 21 U/L (ref 0–37)
Albumin: 3.8 g/dL (ref 3.5–5.2)
Alkaline Phosphatase: 84 U/L (ref 39–117)
Calcium: 8.9 mg/dL (ref 8.4–10.5)
Chloride: 104 mEq/L (ref 96–112)
Glucose, Bld: 84 mg/dL (ref 70–99)
Potassium: 4.2 mEq/L (ref 3.5–5.3)
Sodium: 139 mEq/L (ref 135–145)
Total Protein: 6.7 g/dL (ref 6.0–8.3)

## 2011-08-10 LAB — CBC WITH DIFFERENTIAL/PLATELET
BASO%: 0.2 % (ref 0.0–2.0)
EOS%: 3 % (ref 0.0–7.0)
MCH: 30.1 pg (ref 25.1–34.0)
MCHC: 34.3 g/dL (ref 31.5–36.0)
MCV: 87.7 fL (ref 79.5–101.0)
MONO%: 9 % (ref 0.0–14.0)
RBC: 4.65 10*6/uL (ref 3.70–5.45)
RDW: 12.9 % (ref 11.2–14.5)

## 2011-08-12 ENCOUNTER — Ambulatory Visit: Payer: BC Managed Care – PPO | Admitting: Oncology

## 2011-08-14 ENCOUNTER — Encounter: Payer: Self-pay | Admitting: Radiation Oncology

## 2011-08-14 NOTE — Progress Notes (Signed)
Name: Kristen Gibson   MRN: [id number]  Date:  07/21/2011   DOB: May 12, 1956  Status:outpatient    DIAGNOSIS: Breast cancer.  CONSENT VERIFIED: yes   SET UP: Patient is setup supine   IMMOBILIZATION:  The following immobilization was used:Custom Moldable Pillow, breast board.   NARRATIVE: Kristen Gibson underwent complex simulation and treatment planning for her boost treatment today.  Her tumor volume was outlined on the planning CT scan.  Due to the depth of her cavity, electrons could not be used and a photon plan was developed. The plan will be prescribed to the 99%  Isodose line.    3  fields will be used with  MLCs comprising 3  treatment devices.

## 2011-08-14 NOTE — Progress Notes (Signed)
CC:   [name], M.D. Sandria Bales. Ezzard Standing, M.D.  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  TREATMENT DATES:  06/17/2011 to 08/04/2011.  ANATOMIC REGION TREATED: 1. Right breast. 2. Right breast boost.  DOSE: 1. 45 Gy at 1.8 Gy per fraction x25 fractions. 2. 16 Gy at 2 Gy per fraction x8 fractions.  BEAM ENERGY: 1. 6 and 10 mV photons. 2. 6 and 18 MeV photons.  BEAM ARRANGEMENT: 1. Opposed laterals with reduced fields. 2. Three-field.  TREATMENT TOLERANCE:  Giuliana tolerated her treatments well.  She was able to work throughout her entire course of treatment.  She did experience some dry desquamation under her right breast which was treated with RadiaPlex and Neosporin.  FOLLOWUP:  I will plan on seeing her back in 1 month's time.  She will also be seeing Dr. Darnelle Catalan for further consideration of antiestrogen treatment.    ______________________________ Lurline Hare, M.D. SW/MEDQ  D:  08/14/2011  T:  08/14/2011  Job:  [id number]

## 2011-08-19 ENCOUNTER — Ambulatory Visit (HOSPITAL_BASED_OUTPATIENT_CLINIC_OR_DEPARTMENT_OTHER): Payer: BC Managed Care – PPO | Admitting: Oncology

## 2011-08-19 VITALS — BP 119/86 | HR 83 | Temp 97.4°F | Ht 64.0 in | Wt 219.2 lb

## 2011-08-19 DIAGNOSIS — C50919 Malignant neoplasm of unspecified site of unspecified female breast: Secondary | ICD-10-CM

## 2011-08-19 DIAGNOSIS — C50911 Malignant neoplasm of unspecified site of right female breast: Secondary | ICD-10-CM

## 2011-08-21 NOTE — Progress Notes (Signed)
ID: Kristen Gibson   DOB: May 07, 1956  MR#: [id number]  WUJ#:[id number]  HISTORY OF PRESENT ILLNESS: The patient had been monitored with short interval mammography over two years for unexplained bruising of the right breast. In October 2012  a small spiculated mass was noted in the outer portion of the right breast posteriorly.  Ultrasound confirmed a small irregularly-marginated lesion at the 9 o'clock position 9 cm from the right nipple measuring 4 x 4 x 5 mm.  The Right axilla was unremarkable. Ultrasound-guided core biopsy on 04/12/2011 revealed a low-grade ductal carcinoma that was estrogen receptor positive at 60%, progesterone receptor positive at 8%, Ki-67 was 5% and HER2/neu negative with a ratio by CISH of 1.29.  Subseuently bilateral breast MRI showed a 9 x 8 x 6 mm mass deep in the upper outer quadrant of the right breast.  No other abnormalities were noted. The patient underwent definitive Right lumpectomy and sentinel lymph node sampling 05/13/2011 with results as noted below. . INTERVAL HISTORY: The patient completed her radiation treatments in January of 2013. She now presents to discuss anti-estrogen therapy, overall prognosis, and issues related to survivorship.  REVIEW OF SYSTEMS: She did well with her radiation treatments, and was able to continue to work with no interruptions. She had some erythema but no wet desquamation. Fatigue was mild to moderate. A detailed review of systems today is significant for some discomfort in her right areola. She has occasional shooting pains in the right breast area. These are very brief. Otherwise a detailed review of systems was unremarkable.  PAST MEDICAL HISTORY: Past Medical History  Diagnosis Date  . Arthritis   . Hypothyroid   . Cancer 05/2011    right breast  . Breast cancer   . Anxiety     no medications for anxiety   history of minimal tobacco abuse, resolved 2011  rosacea  PAST SURGICAL HISTORY: Past Surgical History  Procedure  Date  . Tubal ligation   . Tonsillectomy   . Breast surgery 2012    Rt Breast Lump/cancer    FAMILY HISTORY Family History  Problem Relation Age of Onset  . Cancer Mother     kidney  . Kidney cancer Mother   . Cancer Maternal Grandmother     colon  . Colon cancer Maternal Grandmother   . Cancer Paternal Grandmother     colon  . Colon cancer Paternal Grandmother   The patient's father is alive at 35.  Mother died at the age of 23  from kidney cancer.The patient had no sisters. One brother is alive and well.  .  The patient's maternal grandmother had colon cancer, diagnosed at unknown age, and paternal grandmother had colon cancer diagnosed at age 67. There is no history of breast or ovarian cancer in the family to her knowledge.  GYNECOLOGIC HISTORY: GX P0. She is aware that not carrying a child to term before age 64 doubles the risk of breast cancer. Menarche at age 7.  She has not had a menstrual period since April 2012.  She is not on hormone replacement therapy. She is having mild hot flashes.  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 Her elderly father lives with her. During the day her brother stops by (he works in a warehouse, third shift). Also at home are 20 strongly in shepherds.    ADVANCED DIRECTIVES: in place  HEALTH MAINTENANCE: History  Substance Use Topics  . Smoking status: Former Smoker --  0.3 packs/day for 30 years    Types: Cigarettes    Quit date: 05/09/2009  . Smokeless tobacco: Never Used  . Alcohol Use: No   smoked about a half a pack per day for about 20 years, quitting 2 years ago.   Colonoscopy: Dec 2012  Marina Goodell)  PAP: UTD Gerald Leitz)  Bone density: 2012, "normal"  Lipid panel:  No Known Allergies  Current Outpatient Prescriptions  Medication Sig Dispense Refill  . ascorbic acid (VITAMIN C) 500 MG tablet Take 500 mg by mouth daily.        . Cholecalciferol (VITAMIN D) 1000 UNITS capsule Take 1,000  Units by mouth daily.        . fish oil-omega-3 fatty acids 1000 MG capsule Take 1 g by mouth daily.        Marland Kitchen levothyroxine (SYNTHROID, LEVOTHROID) 137 MCG tablet Take 137 mcg by mouth daily.        . minocycline (MINOCIN,DYNACIN) 100 MG capsule Take 100 mg by mouth daily as needed. For rosacea       . POTASSIUM CHLORIDE PO Take 595 mg by mouth as needed. OTC/leg cramps      . vitamin E 400 UNIT capsule Take 400 Units by mouth daily.        Marland Kitchen FINACEA 15 % cream         OBJECTIVE: Middle-aged white woman who appears slightly anxious Filed Vitals:   08/19/11 1635  BP: 119/86  Pulse: 83  Temp: 97.4 F (36.3 C)     Body mass index is 37.63 kg/(m^2).    ECOG FS: 0  Sclerae unicteric Oropharynx clear No peripheral adenopathy, and specifically no significant findings in the right axilla Lungs no rales or rhonchi Heart regular rate and rhythm Abd benign MSK no focal spinal tenderness, no peripheral edema Neuro: nonfocal Breasts: Right breast status post lumpectomy and radiation. There is still minimal erythema. The area of the nipple and areola appear unremarkable. There is no scaling or discharge. The left breast was unremarkable  LAB RESULTS: Preop CA 27-29 was uninformative at 20  Lab Results  Component Value Date   WBC 5.9 08/10/2011   NEUTROABS 4.2 08/10/2011   HGB 14.0 08/10/2011   HCT 40.8 08/10/2011   MCV 87.7 08/10/2011   PLT 237 08/10/2011      Chemistry      Component Value Date/Time   NA 139 08/10/2011 0816   K 4.2 08/10/2011 0816   CL 104 08/10/2011 0816   CO2 25 08/10/2011 0816   BUN 15 08/10/2011 0816   CREATININE 0.76 08/10/2011 0816      Component Value Date/Time   CALCIUM 8.9 08/10/2011 0816   ALKPHOS 84 08/10/2011 0816   AST 21 08/10/2011 0816   ALT 22 08/10/2011 0816   BILITOT 0.4 08/10/2011 0816       No results found for this basename: INR:1;PROTIME:1 in the last 168 hours  No results found for this basename:  UACOL:1,UAPR:1,USPG:1,UPH:1,UTP:1,UGL:1,UKET:1,UBIL:1,UHGB:1,UNIT:1,UROB:1,ULEU:1,UEPI:1,UWBC:1,URBC:1,UBAC:1,CAST:1,CRYS:1,UCOM:1,BILUA:1 in the last 72 hours   STUDIES: No new results found.  ASSESSMENT: 56 year old [place] woman status post right lumpectomy and sentinel lymph node biopsy 05/13/2011 for a T1c N0 invasive ductal carcinoma, grade 1, estrogen receptor 69% and progesterone receptor 84% positive, with an MIB-1 of 6%, and no HER-2 amplification. She completed adjuvant radiation January 2013.  PLAN: She is now ready to consider antiestrogen therapy. While NCCN guidelines suggest Oncotype testing in patients like her, the prognostic panel strongly suggests a luminal A subtype and I  would expect minimal to no benefit from chemotherapy on that basis alone. The Adjuvant! Program would call her a risk of death of 3% or so with local treatment only, the risk of recurrence within 10 years of 18%, which can be cut in half with antiestrogen therapy. She is a very good understanding of this information, which was given to her in writing, and is very much in agreement with forgoing chemotherapy.   We then discussed the varieties of anti-estrogen therapy and in her case, because she was menstruating less than a year ago, I think tamoxifen would be a good initial choice. She has a good understanding of the possible toxicities side effects and complications of this medication, and I went ahead and wrote her a prescription. After 2-3 years, we will reassess for menopausal status and consider switching to an aromatase inhibitor.  She will return to see [place] in about 3 months. If all is going well at that time and we will initiate long-term followup then.  Amonie Wisser C    08/21/2011

## 2011-08-23 ENCOUNTER — Telehealth: Payer: Self-pay | Admitting: Oncology

## 2011-08-23 NOTE — Telephone Encounter (Signed)
lmonvm adviisng the pt of her appts for April and may 2013

## 2011-08-25 ENCOUNTER — Encounter: Payer: Self-pay | Admitting: *Deleted

## 2011-08-26 ENCOUNTER — Encounter: Payer: Self-pay | Admitting: Radiation Oncology

## 2011-08-26 ENCOUNTER — Ambulatory Visit
Admission: RE | Admit: 2011-08-26 | Discharge: 2011-08-26 | Disposition: A | Discharge status: Home / Self Care | Payer: BC Managed Care – PPO | Source: Ambulatory Visit | Attending: Radiation Oncology | Admitting: Radiation Oncology

## 2011-08-26 VITALS — BP 119/73 | HR 73 | Temp 98.3°F | Resp 20 | Wt 218.0 lb

## 2011-08-26 DIAGNOSIS — C50911 Malignant neoplasm of unspecified site of right female breast: Secondary | ICD-10-CM

## 2011-08-26 NOTE — Progress Notes (Signed)
CC:   [name], M.D. Sandria Bales. Ezzard Standing, M.D.  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  PREVIOUS RADIATION:  To a total dose of 61 Gy completed 08/04/2011.  INTERVAL SINCE TREATMENT:  1 month.  INTERVAL HISTORY:  Kristen Gibson reports for followup today.  She was seen by Dr. Darnelle Catalan and started on tamoxifen last week.  She is tolerating that well.  Her skin has healed up nicely.  She is pleased with her cosmetic result.  PHYSICAL EXAMINATION:  She has some hyperpigmentation in the inframammary fold.  She has dry skin laterally in the right breast. Otherwise her skin appears normal.  IMPRESSION:  Early stage breast cancer, status post breast conservation, with resolving acute effects of treatment.  RECOMMENDATIONS:  [name] looks great.  We talked about wearing a high SPF on that breast and its sensitivity to the sun.  She knows she can contact me with any questions or concerns.    ______________________________ Lurline Hare, M.D. SW/MEDQ  D:  08/26/2011  T:  08/26/2011  Job:  [id number]

## 2011-08-26 NOTE — Progress Notes (Signed)
Pt started Tamoxifen 20mg  daily since 08/20/11, no c/o pain, occasional shooting zaos right breast, skin well healed No new meds other than the tamoxifen 8:01 AM

## 2011-10-14 ENCOUNTER — Ambulatory Visit: Payer: BC Managed Care – PPO | Admitting: Oncology

## 2011-10-14 ENCOUNTER — Other Ambulatory Visit: Payer: BC Managed Care – PPO | Admitting: Lab

## 2011-10-28 ENCOUNTER — Other Ambulatory Visit (HOSPITAL_BASED_OUTPATIENT_CLINIC_OR_DEPARTMENT_OTHER): Payer: PRIVATE HEALTH INSURANCE | Admitting: Lab

## 2011-10-28 DIAGNOSIS — C50911 Malignant neoplasm of unspecified site of right female breast: Secondary | ICD-10-CM

## 2011-10-28 DIAGNOSIS — C50919 Malignant neoplasm of unspecified site of unspecified female breast: Secondary | ICD-10-CM

## 2011-10-28 LAB — CBC WITH DIFFERENTIAL/PLATELET
Eosinophils Absolute: 0.2 10*3/uL (ref 0.0–0.5)
MCV: 92.3 fL (ref 79.5–101.0)
MONO#: 0.5 10*3/uL (ref 0.1–0.9)
MONO%: 7.5 % (ref 0.0–14.0)
NEUT#: 4.1 10*3/uL (ref 1.5–6.5)
RBC: 4.24 10*6/uL (ref 3.70–5.45)
RDW: 12.8 % (ref 11.2–14.5)
WBC: 6.3 10*3/uL (ref 3.9–10.3)
lymph#: 1.4 10*3/uL (ref 0.9–3.3)
nRBC: 0 % (ref 0–0)

## 2011-11-04 ENCOUNTER — Telehealth: Payer: Self-pay | Admitting: *Deleted

## 2011-11-04 ENCOUNTER — Ambulatory Visit (HOSPITAL_BASED_OUTPATIENT_CLINIC_OR_DEPARTMENT_OTHER): Payer: PRIVATE HEALTH INSURANCE | Admitting: Oncology

## 2011-11-04 VITALS — BP 128/81 | HR 80 | Temp 98.6°F | Ht 64.0 in | Wt 219.9 lb

## 2011-11-04 DIAGNOSIS — C50419 Malignant neoplasm of upper-outer quadrant of unspecified female breast: Secondary | ICD-10-CM

## 2011-11-04 DIAGNOSIS — Z170 Estrogen receptor positive status [ER+]: Secondary | ICD-10-CM

## 2011-11-04 DIAGNOSIS — C50911 Malignant neoplasm of unspecified site of right female breast: Secondary | ICD-10-CM

## 2011-11-04 MED ORDER — TAMOXIFEN CITRATE 20 MG PO TABS
20.0000 mg | ORAL_TABLET | Freq: Every day | ORAL | Status: DC
Start: 2011-11-04 — End: 2020-01-23

## 2011-11-04 NOTE — Telephone Encounter (Signed)
gave patient appointment for 05-01-2012 starting at 3:30pm with labs printed out calendar and gave to the patient

## 2011-11-04 NOTE — Progress Notes (Signed)
ID: Kristen Gibson   DOB: [birth year]  MR#: [id number]  NWG#:[id number]  HISTORY OF PRESENT ILLNESS: The patient had been monitored with short interval mammography over two years for unexplained bruising of the right breast. In October 2012  a small spiculated mass was noted in the outer portion of the right breast posteriorly.  Ultrasound confirmed a small irregularly-marginated lesion at the 9 o'clock position 9 cm from the right nipple measuring 4 x 4 x 5 mm.  The Right axilla was unremarkable. Ultrasound-guided core biopsy on 04/12/2011 revealed a low-grade ductal carcinoma that was estrogen receptor positive at 60%, progesterone receptor positive at 8%, Ki-67 was 5% and HER2/neu negative with a ratio by CISH of 1.29.  Subseuently bilateral breast MRI showed a 9 x 8 x 6 mm mass deep in the upper outer quadrant of the right breast.  No other abnormalities were noted. The patient underwent definitive Right lumpectomy and sentinel lymph node sampling 05/13/2011 with results and subsequent history as noted below. . INTERVAL HISTORY: Kristen Gibson returns today for routine followup of her breast cancer. She started tamoxifen in January, and she is tolerating that quite well. She has had minimal increase in hot flashes. She finds them quite tolerable. Of course she continues to work for [employer]. She tells me there now 59 people in the office.  REVIEW OF SYSTEMS: She occasionally has sharp pains in the right breast. She was reassured that this is very common and it does not mean that she is having recurrent breast cancer. She's not having any vaginal dryness or vaginal wetness issues. A detailed review of systems was otherwise entirely negative  PAST MEDICAL HISTORY: Past Medical History  Diagnosis Date  . Arthritis   . Cancer 05/2011    right breast  . Anxiety     no medications for anxiety  . S/P radiation therapy 06/17/2011 - 08/04/2011    Right Breast - 45 Gy/25 fractions whole breast, and 16 Gy/8 fractions  for boost  . Breast cancer     2012  . Hypothyroid   . Psoriasis    history of minimal tobacco abuse, resolved 2011  rosacea  PAST SURGICAL HISTORY: Past Surgical History  Procedure Date  . Tubal ligation     At age 33  . Tonsillectomy   . Breast surgery 05/13/11    Rt Breast Lumpectomy with sentinel node biospy, invasive ductal ca, 0/2 nodes     FAMILY HISTORY Family History  Problem Relation Age of Onset  . Cancer Mother     kidney  . Kidney cancer Mother   . Cancer Maternal Grandmother     colon  . Colon cancer Maternal Grandmother   . Cancer Paternal Grandmother     colon  . Colon cancer Paternal Grandmother   The patient's father is alive at 59.  Mother died at the age of 80  from kidney cancer.The patient had no sisters. One brother is alive and well.  .  The patient's maternal grandmother had colon cancer, diagnosed at unknown age, and paternal grandmother had colon cancer diagnosed at age 47. There is no history of breast or ovarian cancer in the family to her knowledge.  GYNECOLOGIC HISTORY: GX P0. She is aware that not carrying a child to term before age 60 doubles the risk of breast cancer. Menarche at age 89.  She has not had a menstrual period since April 2012.  She never took hormone replacement therapy. She is having mild hot flashes.  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 Her elderly father lives with her. During the day her brother stops by (he works in a warehouse, third shift). Also at home are 2 [place] shepherds.    ADVANCED DIRECTIVES: in place  HEALTH MAINTENANCE: History  Substance Use Topics  . Smoking status: Former Smoker -- 0.3 packs/day for 30 years    Types: Cigarettes    Quit date: 05/09/2009  . Smokeless tobacco: Never Used  . Alcohol Use: No   smoked about a half a pack per day for about 20 years, quitting 2 years ago.   Colonoscopy: Dec 2012  Marina Goodell)  PAP: UTD Gerald Leitz)  Bone  density: 2012, "normal"  Lipid panel:  No Known Allergies  Current Outpatient Prescriptions  Medication Sig Dispense Refill  . ascorbic acid (VITAMIN C) 500 MG tablet Take 500 mg by mouth daily.        . Cholecalciferol (VITAMIN D) 1000 UNITS capsule Take 1,000 Units by mouth daily.        Marland Kitchen FINACEA 15 % cream       . fish oil-omega-3 fatty acids 1000 MG capsule Take 1 g by mouth daily.        Marland Kitchen levothyroxine (SYNTHROID, LEVOTHROID) 137 MCG tablet Take 137 mcg by mouth daily.        . minocycline (MINOCIN,DYNACIN) 100 MG capsule Take 100 mg by mouth daily as needed. For rosacea       . POTASSIUM CHLORIDE PO Take 595 mg by mouth as needed. OTC/leg cramps      . tamoxifen (NOLVADEX) 20 MG tablet Take 20 mg by mouth daily.      . vitamin E 400 UNIT capsule Take 400 Units by mouth daily.          OBJECTIVE: Middle-aged white woman who appears slightly well Filed Vitals:   11/04/11 1554  BP: 128/81  Pulse: 80  Temp: 98.6 F (37 C)     Body mass index is 37.75 kg/(m^2).    ECOG FS: 0  Sclerae unicteric Oropharynx clear No peripheral adenopathy Lungs no rales or rhonchi Heart regular rate and rhythm Abd benign MSK no focal spinal tenderness, no peripheral edema Neuro: nonfocal Breasts: Right breast status post lumpectomy and radiation. There is still minimal hyperpigmentation The left breast was unremarkable  LAB RESULTS: Preop CA 27-29 was uninformative at 20  Lab Results  Component Value Date   WBC 6.3 10/28/2011   NEUTROABS 4.1 10/28/2011   HGB 13.5 10/28/2011   HCT 39.1 10/28/2011   MCV 92.3 10/28/2011   PLT 222 10/28/2011      Chemistry      Component Value Date/Time   NA 139 08/10/2011 0816   K 4.2 08/10/2011 0816   CL 104 08/10/2011 0816   CO2 25 08/10/2011 0816   BUN 15 08/10/2011 0816   CREATININE 0.76 08/10/2011 0816      Component Value Date/Time   CALCIUM 8.9 08/10/2011 0816   ALKPHOS 84 08/10/2011 0816   AST 21 08/10/2011 0816   ALT 22 08/10/2011 0816   BILITOT 0.4  08/10/2011 0816       No results found for this basename: INR:1;PROTIME:1 in the last 168 hours  No results found for this basename: UACOL:1,UAPR:1,USPG:1,UPH:1,UTP:1,UGL:1,UKET:1,UBIL:1,UHGB:1,UNIT:1,UROB:1,ULEU:1,UEPI:1,UWBC:1,URBC:1,UBAC:1,CAST:1,CRYS:1,UCOM:1,BILUA:1 in the last 72 hours   STUDIES: No new results found.  ASSESSMENT: 56 year old [place] woman status post right lumpectomy and sentinel lymph node biopsy 05/13/2011 for a T1c N0, stage IA invasive ductal carcinoma,  grade 1, estrogen receptor 69% and progesterone receptor 84% positive, with an MIB-1 of 6%, and no HER-2 amplification. She completed adjuvant radiation January 2013 and started on tamoxifen at that time  PLAN: She is tolerating tamoxifen very well and the plan will be to continue that for at least 2 years then reassess. We're going to see her on a every 6 month basis for the next year and a half, next visit being in November, shortly after her next mammogram. We will then see her again in may and November of 2014. At that point we will start seeing her on a once a year basis.  She knows to call for any problems that may develop before the next visit  Chessa Barrasso C    11/04/2011

## 2011-11-16 ENCOUNTER — Other Ambulatory Visit: Payer: Self-pay | Admitting: Obstetrics and Gynecology

## 2011-11-16 ENCOUNTER — Other Ambulatory Visit (HOSPITAL_COMMUNITY)
Admission: RE | Admit: 2011-11-16 | Discharge: 2011-11-16 | Disposition: A | Discharge status: Home / Self Care | Payer: PRIVATE HEALTH INSURANCE | Source: Ambulatory Visit | Attending: Obstetrics and Gynecology | Admitting: Obstetrics and Gynecology

## 2011-11-16 DIAGNOSIS — Z01419 Encounter for gynecological examination (general) (routine) without abnormal findings: Secondary | ICD-10-CM | POA: Insufficient documentation

## 2012-02-17 ENCOUNTER — Other Ambulatory Visit: Payer: Self-pay | Admitting: Oncology

## 2012-02-17 DIAGNOSIS — Z853 Personal history of malignant neoplasm of breast: Secondary | ICD-10-CM

## 2012-03-17 ENCOUNTER — Telehealth: Payer: Self-pay | Admitting: Oncology

## 2012-03-17 NOTE — Telephone Encounter (Signed)
S/w the pt and she is aware of the r/s oct appts to nov due to the md is on call in oct.

## 2012-04-19 ENCOUNTER — Ambulatory Visit
Admission: RE | Admit: 2012-04-19 | Discharge: 2012-04-19 | Disposition: A | Discharge status: Home / Self Care | Payer: PRIVATE HEALTH INSURANCE | Source: Ambulatory Visit | Attending: Oncology | Admitting: Oncology

## 2012-04-19 DIAGNOSIS — Z853 Personal history of malignant neoplasm of breast: Secondary | ICD-10-CM

## 2012-05-01 ENCOUNTER — Ambulatory Visit: Payer: PRIVATE HEALTH INSURANCE | Admitting: Oncology

## 2012-05-01 ENCOUNTER — Other Ambulatory Visit: Payer: PRIVATE HEALTH INSURANCE | Admitting: Lab

## 2012-05-29 ENCOUNTER — Telehealth: Payer: Self-pay | Admitting: Oncology

## 2012-05-29 ENCOUNTER — Other Ambulatory Visit: Payer: PRIVATE HEALTH INSURANCE | Admitting: Lab

## 2012-05-29 ENCOUNTER — Ambulatory Visit (HOSPITAL_BASED_OUTPATIENT_CLINIC_OR_DEPARTMENT_OTHER): Payer: PRIVATE HEALTH INSURANCE | Admitting: Oncology

## 2012-05-29 VITALS — BP 134/84 | HR 69 | Temp 98.0°F | Resp 20 | Ht 64.0 in | Wt 224.8 lb

## 2012-05-29 DIAGNOSIS — Z170 Estrogen receptor positive status [ER+]: Secondary | ICD-10-CM

## 2012-05-29 DIAGNOSIS — C50911 Malignant neoplasm of unspecified site of right female breast: Secondary | ICD-10-CM

## 2012-05-29 DIAGNOSIS — C50419 Malignant neoplasm of upper-outer quadrant of unspecified female breast: Secondary | ICD-10-CM

## 2012-05-29 NOTE — Progress Notes (Signed)
ID: Kristen Gibson   DOB: 08-27-1955  MR#: [id number]  WUJ#:[id number]  HISTORY OF PRESENT ILLNESS: The patient had been monitored with short interval mammography over two years for unexplained bruising of the right breast. In October 2012  a small spiculated mass was noted in the outer portion of the right breast posteriorly.  Ultrasound confirmed a small irregularly-marginated lesion at the 9 o'clock position 9 cm from the right nipple measuring 4 x 4 x 5 mm.  The Right axilla was unremarkable. Ultrasound-guided core biopsy on 04/12/2011 revealed a low-grade ductal carcinoma that was estrogen receptor positive at 60%, progesterone receptor positive at 8%, Ki-67 was 5% and HER2/neu negative with a ratio by CISH of 1.29.  Subseuently bilateral breast MRI showed a 9 x 8 x 6 mm mass deep in the upper outer quadrant of the right breast.  No other abnormalities were noted. The patient underwent definitive Right lumpectomy and sentinel lymph node sampling 05/13/2011 with results and subsequent history as noted below. . INTERVAL HISTORY: Brexlee returns today for routine followup of her breast cancer. Since the last visit here she's had to put her dad in the hospital and he is likely to end up in skilled nursing. She is not exercising regularly-"too much is going on".  REVIEW OF SYSTEMS: She has been very emotional lately. Partly because of her dad's situation, but also because of the breast cancer issue. She has a very good understanding of her excellent prognosis, so that is not the problem. She has had minimal clear discharge from her right nipple-just a little wetness she notices in her bra at the end of the day sometimes. There has been no blood. Otherwise she is tolerating the tamoxifen well, with hot flashes as the only significant side effect. These are moderate. She has some psoriasis, under control. A detailed review of systems today was otherwise noncontributory  PAST MEDICAL HISTORY: Past Medical  History  Diagnosis Date  . Arthritis   . Cancer 05/2011    right breast  . Anxiety     no medications for anxiety  . S/P radiation therapy 06/17/2011 - 08/04/2011    Right Breast - 45 Gy/25 fractions whole breast, and 16 Gy/8 fractions for boost  . Breast cancer     2012  . Hypothyroid   . Psoriasis    history of minimal tobacco abuse, resolved 2011  rosacea  PAST SURGICAL HISTORY: Past Surgical History  Procedure Date  . Tubal ligation     At age 36  . Tonsillectomy   . Breast surgery 05/13/11    Rt Breast Lumpectomy with sentinel node biospy, invasive ductal ca, 0/2 nodes     FAMILY HISTORY Family History  Problem Relation Age of Onset  . Cancer Mother     kidney  . Kidney cancer Mother   . Cancer Maternal Grandmother     colon  . Colon cancer Maternal Grandmother   . Cancer Paternal Grandmother     colon  . Colon cancer Paternal Grandmother   The patient's father is alive at 59.  Mother died at the age of 44  from kidney cancer.The patient had no sisters. One brother is alive and well.  The patient's maternal grandmother had colon cancer, diagnosed at unknown age, and paternal grandmother had colon cancer diagnosed at age 56. There is no history of breast or ovarian cancer in the family to her knowledge.  GYNECOLOGIC HISTORY: GX P0. She is aware that not carrying a child to term  before age 68 doubles the risk of breast cancer. Menarche at age 65.  She has not had a menstrual period since April 2012.  She never took hormone replacement therapy. She is having mild hot flashes.  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 Her elderly father has live with her, but at least temporarily he will be in a skilled nursing facility starting November 2013. The patient's brother works in a warehouse, third shift. Also at home are 2 [place] shepherds.    ADVANCED DIRECTIVES: in place  HEALTH MAINTENANCE: History  Substance Use Topics    . Smoking status: Former Smoker -- 0.3 packs/day for 30 years    Types: Cigarettes    Quit date: 05/09/2009  . Smokeless tobacco: Never Used  . Alcohol Use: No   smoked about a half a pack per day for about 20 years, quitting 2 years ago.   Colonoscopy: Dec 2012  Marina Goodell)  PAP: UTD Gerald Leitz)  Bone density: 2012, "normal"  Lipid panel:  No Known Allergies  Current Outpatient Prescriptions  Medication Sig Dispense Refill  . ascorbic acid (VITAMIN C) 500 MG tablet Take 500 mg by mouth daily.        . Cholecalciferol (VITAMIN D) 1000 UNITS capsule Take 1,000 Units by mouth daily.        Marland Kitchen FINACEA 15 % cream       . fish oil-omega-3 fatty acids 1000 MG capsule Take 1 g by mouth daily.        Marland Kitchen levothyroxine (SYNTHROID, LEVOTHROID) 137 MCG tablet Take 137 mcg by mouth daily.        . minocycline (MINOCIN,DYNACIN) 100 MG capsule Take 100 mg by mouth daily as needed. For rosacea       . POTASSIUM CHLORIDE PO Take 595 mg by mouth as needed. OTC/leg cramps      . tamoxifen (NOLVADEX) 20 MG tablet Take 1 tablet (20 mg total) by mouth daily.  90 tablet  12  . vitamin E 400 UNIT capsule Take 400 Units by mouth daily.          OBJECTIVE: Middle-aged white woman in no acute distress Filed Vitals:   05/29/12 1536  BP: 134/84  Pulse: 69  Temp: 98 F (36.7 C)  Resp: 20     Body mass index is 38.59 kg/(m^2).    ECOG FS: 0  Sclerae unicteric Oropharynx clear No peripheral adenopathy Lungs no rales or rhonchi Heart regular rate and rhythm Abd benign MSK no focal spinal tenderness, no peripheral edema Neuro: nonfocal Breasts: Right breast status post lumpectomy and radiation. There is still minimal hyperpigmentation The left breast was unremarkable  LAB RESULTS:  Labs obtained through [facility] 05/15/2012 included a completely normal C. match, a completely normal CBC, and a total cholesterol of 183, with HDL 46, LDL 116. Preop CA 27-29 was uninformative at 20, so it is  not being repeated.  Lab Results  Component Value Date   WBC 6.3 10/28/2011   NEUTROABS 4.1 10/28/2011   HGB 13.5 10/28/2011   HCT 39.1 10/28/2011   MCV 92.3 10/28/2011   PLT 222 10/28/2011      Chemistry      Component Value Date/Time   NA 139 08/10/2011 0816   K 4.2 08/10/2011 0816   CL 104 08/10/2011 0816   CO2 25 08/10/2011 0816   BUN 15 08/10/2011 0816   CREATININE 0.76 08/10/2011 0816      Component  Value Date/Time   CALCIUM 8.9 08/10/2011 0816   ALKPHOS 84 08/10/2011 0816   AST 21 08/10/2011 0816   ALT 22 08/10/2011 0816   BILITOT 0.4 08/10/2011 0816       No results found for this basename: INR:1;PROTIME:1 in the last 168 hours  No results found for this basename: UACOL:1,UAPR:1,USPG:1,UPH:1,UTP:1,UGL:1,UKET:1,UBIL:1,UHGB:1,UNIT:1,UROB:1,ULEU:1,UEPI:1,UWBC:1,URBC:1,UBAC:1,CAST:1,CRYS:1,UCOM:1,BILUA:1 in the last 72 hours   STUDIES: Repeat mammography is due October 2014  ASSESSMENT: 56 year old [place] woman status post right lumpectomy and sentinel lymph node biopsy 05/13/2011 for a T1c N0, stage IA invasive ductal carcinoma, grade 1, estrogen receptor 69% and progesterone receptor 84% positive, with an MIB-1 of 6%, and no HER-2 amplification. She completed adjuvant radiation January 2013 and started on tamoxifen at that time  PLAN: Yarelin is doing quite well, now 1 year out from her definitive surgery. She is dealing with the life changes which are brought on by a diagnosis of cancer, even in cases with a good prognosis, such as hers. Of course she is also grieving her father's decline. I think it would be helpful for her to participate in the "finding your new normal" group here, and I also suggested she read Reynolds Price's "a whole new lifef", although that can be a little rough. I offered her an appointment in 3 months but she prefers to come back in 6. Nothing wrong with that. If the slight discharge from the right breast continues or worsens, she will let us know and we will  obtain a right ductogram for further evaluation. She knows to call for any other problems that may develop before the next visit  MAGRINAT,GUSTAV C    05/29/2012

## 2012-05-29 NOTE — Telephone Encounter (Signed)
gve the pt her June 2014 appt calendar 

## 2012-12-11 ENCOUNTER — Other Ambulatory Visit: Payer: Self-pay | Admitting: Physician Assistant

## 2012-12-11 DIAGNOSIS — C50911 Malignant neoplasm of unspecified site of right female breast: Secondary | ICD-10-CM

## 2012-12-12 ENCOUNTER — Ambulatory Visit (HOSPITAL_BASED_OUTPATIENT_CLINIC_OR_DEPARTMENT_OTHER): Payer: PRIVATE HEALTH INSURANCE | Admitting: Oncology

## 2012-12-12 ENCOUNTER — Other Ambulatory Visit (HOSPITAL_BASED_OUTPATIENT_CLINIC_OR_DEPARTMENT_OTHER): Payer: PRIVATE HEALTH INSURANCE | Admitting: Lab

## 2012-12-12 ENCOUNTER — Telehealth: Payer: Self-pay | Admitting: Oncology

## 2012-12-12 VITALS — BP 123/75 | HR 72 | Temp 98.5°F | Resp 20 | Ht 64.0 in | Wt 218.4 lb

## 2012-12-12 DIAGNOSIS — C50911 Malignant neoplasm of unspecified site of right female breast: Secondary | ICD-10-CM

## 2012-12-12 DIAGNOSIS — C50919 Malignant neoplasm of unspecified site of unspecified female breast: Secondary | ICD-10-CM

## 2012-12-12 LAB — CBC WITH DIFFERENTIAL/PLATELET
BASO%: 0.6 % (ref 0.0–2.0)
HCT: 38.8 % (ref 34.8–46.6)
MCHC: 34.8 g/dL (ref 31.5–36.0)
MONO#: 0.4 10*3/uL (ref 0.1–0.9)
NEUT%: 68.9 % (ref 38.4–76.8)
WBC: 7.4 10*3/uL (ref 3.9–10.3)
lymph#: 1.6 10*3/uL (ref 0.9–3.3)

## 2012-12-12 LAB — COMPREHENSIVE METABOLIC PANEL (CC13)
ALT: 13 U/L (ref 0–55)
Albumin: 3.3 g/dL — ABNORMAL LOW (ref 3.5–5.0)
CO2: 26 mEq/L (ref 22–29)
Calcium: 8.8 mg/dL (ref 8.4–10.4)
Chloride: 106 mEq/L (ref 98–107)
Creatinine: 0.9 mg/dL (ref 0.6–1.1)
Sodium: 142 mEq/L (ref 136–145)
Total Protein: 6.7 g/dL (ref 6.4–8.3)

## 2012-12-12 NOTE — Progress Notes (Signed)
ID: Kristen Gibson   DOB: 1955-12-17  MR#: [id number]  WUJ#:[id number]  HISTORY OF PRESENT ILLNESS: The patient had been monitored with short interval mammography over two years for unexplained bruising of the right breast. In October 2012  a small spiculated mass was noted in the outer portion of the right breast posteriorly.  Ultrasound confirmed a small irregularly-marginated lesion at the 9 o'clock position 9 cm from the right nipple measuring 4 x 4 x 5 mm.  The Right axilla was unremarkable. Ultrasound-guided core biopsy on 04/12/2011 revealed a low-grade ductal carcinoma that was estrogen receptor positive at 60%, progesterone receptor positive at 8%, Ki-67 was 5% and HER2/neu negative with a ratio by CISH of 1.29.  Subseuently bilateral breast MRI showed a 9 x 8 x 6 mm mass deep in the upper outer quadrant of the right breast.  No other abnormalities were noted. The patient underwent definitive Right lumpectomy and sentinel lymph node sampling 05/13/2011 with results and subsequent history as noted below. . INTERVAL HISTORY: Kristen Gibson returns today for followup of her breast cancer.the interval history is significant for her father continuing to decline. He has been in a nursing home and currently is in a mammary facility and she is thinking that perhaps hospice may be appropriate for him. We did discuss at length today.   REVIEW OF SYSTEMS: She has had no side effects from tamoxifen other than the occasional hot flash, but on the other hand she is not taking it regularly. She thinks she may be taking it about once a week. She describes herself as mildly fatigued. She is not exercising regularly although she does walk her dog everyday. She has some sinus symptoms little bit of a dry cough. She is not taking anything for that. A detailed review of systems today was otherwise entirely negative.  PAST MEDICAL HISTORY: Past Medical History  Diagnosis Date  . Arthritis   . Cancer 05/2011    right  breast  . Anxiety     no medications for anxiety  . S/P radiation therapy 06/17/2011 - 08/04/2011    Right Breast - 45 Gy/25 fractions whole breast, and 16 Gy/8 fractions for boost  . Breast cancer     2012  . Hypothyroid   . Psoriasis    history of minimal tobacco abuse, resolved 2011  rosacea  PAST SURGICAL HISTORY: Past Surgical History  Procedure Laterality Date  . Tubal ligation      At age 89  . Tonsillectomy    . Breast surgery  05/13/11    Rt Breast Lumpectomy with sentinel node biospy, invasive ductal ca, 0/2 nodes     FAMILY HISTORY Family History  Problem Relation Age of Onset  . Cancer Mother     kidney  . Kidney cancer Mother   . Cancer Maternal Grandmother     colon  . Colon cancer Maternal Grandmother   . Cancer Paternal Grandmother     colon  . Colon cancer Paternal Grandmother   The patient's father is alive at 62.  Mother died at the age of 66  from kidney cancer.The patient had no sisters. One brother is alive and well.  The patient's maternal grandmother had colon cancer, diagnosed at unknown age, and paternal grandmother had colon cancer diagnosed at age 10. There is no history of breast or ovarian cancer in the family to her knowledge.  GYNECOLOGIC HISTORY: GX P0. She is aware that not carrying a child to term before age 9 doubles the  risk of breast cancer. Menarche at age 76.  She has not had a menstrual period since April 2012.  She never took hormone replacement therapy. She is having mild hot flashes.  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 Her elderly father has live with her, but at least temporarily he will be in a skilled nursing facility starting November 2013. The patient's brother works in a warehouse, third shift. Also at home are 2 [place] shepherds.    ADVANCED DIRECTIVES: in place  HEALTH MAINTENANCE: History  Substance Use Topics  . Smoking status: Former Smoker -- 0.30 packs/day for  30 years    Types: Cigarettes    Quit date: 05/09/2009  . Smokeless tobacco: Never Used  . Alcohol Use: No   smoked about a half a pack per day for about 20 years, quitting 2 years ago.   Colonoscopy: Dec 2012  Marina Goodell)  PAP: UTD Gerald Leitz)  Bone density: 2012, "normal"  Lipid panel:  No Known Allergies  Current Outpatient Prescriptions  Medication Sig Dispense Refill  . ascorbic acid (VITAMIN C) 500 MG tablet Take 500 mg by mouth daily.        . Cholecalciferol (VITAMIN D) 1000 UNITS capsule Take 1,000 Units by mouth daily.        Marland Kitchen FINACEA 15 % cream       . fish oil-omega-3 fatty acids 1000 MG capsule Take 1 g by mouth daily.        Marland Kitchen levothyroxine (SYNTHROID, LEVOTHROID) 137 MCG tablet Take 137 mcg by mouth daily.        . minocycline (MINOCIN,DYNACIN) 100 MG capsule Take 100 mg by mouth daily as needed. For rosacea       . POTASSIUM CHLORIDE PO Take 595 mg by mouth as needed. OTC/leg cramps      . tamoxifen (NOLVADEX) 20 MG tablet Take 1 tablet (20 mg total) by mouth daily.  90 tablet  12  . vitamin E 400 UNIT capsule Take 400 Units by mouth daily.         No current facility-administered medications for this visit.    OBJECTIVE: Middle-aged white woman who appears mildly fatigued  Filed Vitals:   12/12/12 0913  BP: 123/75  Pulse: 72  Temp: 98.5 F (36.9 C)  Resp: 20     Body mass index is 37.47 kg/(m^2).    ECOG FS: 0  Sclerae unicteric Oropharynx clear No peripheral adenopathy Lungs no rales or rhonchi Heart regular rate and rhythm Abd  obese, benign MSK no focal spinal tenderness, no peripheral edema Neuro: nonfocal, well oriented, appropriate affect  Breasts: Right breast status post lumpectomy and radiation with no evidence of local recurrence.the right excellent is benign The left breast  is  unremarkable  LAB RESULTS:   Lab Results  Component Value Date   WBC 7.4 12/12/2012   NEUTROABS 5.1 12/12/2012   HGB 13.5 12/12/2012   HCT 38.8 12/12/2012   MCV  92.0 12/12/2012   PLT 230 12/12/2012      Chemistry      Component Value Date/Time   NA 142 12/12/2012 0847   NA 139 08/10/2011 0816   K 4.1 12/12/2012 0847   K 4.2 08/10/2011 0816   CL 106 12/12/2012 0847   CL 104 08/10/2011 0816   CO2 26 12/12/2012 0847   CO2 25 08/10/2011 0816   BUN 11.2 12/12/2012 0847   BUN 15 08/10/2011 0816   CREATININE 0.9 12/12/2012  0847   CREATININE 0.76 08/10/2011 0816      Component Value Date/Time   CALCIUM 8.8 12/12/2012 0847   CALCIUM 8.9 08/10/2011 0816   ALKPHOS 83 12/12/2012 0847   ALKPHOS 84 08/10/2011 0816   AST 12 12/12/2012 0847   AST 21 08/10/2011 0816   ALT 13 12/12/2012 0847   ALT 22 08/10/2011 0816   BILITOT 0.46 12/12/2012 0847   BILITOT 0.4 08/10/2011 0816       No results found for this basename: INR,  in the last 168 hours  No results found for this basename: UACOL, UAPR, USPG, UPH, UTP, UGL, UKET, UBIL, UHGB, UNIT, UROB, ULEU, UEPI, UWBC, URBC, UBAC, CAST, CRYS, UCOM, BILUA,  in the last 72 hours   STUDIES: Repeat mammography is due October 2014  ASSESSMENT: 57 y.o.   [place] woman status post right lumpectomy and sentinel lymph node biopsy 05/13/2011 for a T1c N0, stage IA invasive ductal carcinoma, grade 1, estrogen receptor 69% and progesterone receptor 84% positive, with an MIB-1 of 6%, and no HER-2 amplification. She completed adjuvant radiation January 2013 and started on tamoxifen at that time  PLAN: As noted above, she is really not taking the tamoxifen much at all. We went over the fact that this medication will essentially cut in half the risk of recurrence. It would also cut in half the risk of developing a new breast cancer. She is very engaged with a problem with her father's passing, and I do think hospice will be good for him. I have urged her to take her tamoxifen daily and after a few weeks if she can do that then start a walking program. She is going to see [place] again in 6 months, after her next mammogram. She knows to call for any  problems that may develop before that visit.  Sentoria Brent C    12/12/2012

## 2013-04-19 ENCOUNTER — Other Ambulatory Visit: Payer: Self-pay | Admitting: Oncology

## 2013-04-19 DIAGNOSIS — Z9889 Other specified postprocedural states: Secondary | ICD-10-CM

## 2013-04-19 DIAGNOSIS — Z853 Personal history of malignant neoplasm of breast: Secondary | ICD-10-CM

## 2013-04-27 ENCOUNTER — Other Ambulatory Visit: Payer: Self-pay | Admitting: Oncology

## 2013-05-03 ENCOUNTER — Ambulatory Visit
Admission: RE | Admit: 2013-05-03 | Discharge: 2013-05-03 | Disposition: A | Discharge status: Home / Self Care | Payer: PRIVATE HEALTH INSURANCE | Source: Ambulatory Visit | Attending: Oncology | Admitting: Oncology

## 2013-05-03 DIAGNOSIS — Z9889 Other specified postprocedural states: Secondary | ICD-10-CM

## 2013-05-03 DIAGNOSIS — Z853 Personal history of malignant neoplasm of breast: Secondary | ICD-10-CM

## 2013-06-29 ENCOUNTER — Other Ambulatory Visit: Payer: Self-pay | Admitting: Oncology

## 2013-06-29 DIAGNOSIS — C50911 Malignant neoplasm of unspecified site of right female breast: Secondary | ICD-10-CM

## 2013-08-29 ENCOUNTER — Other Ambulatory Visit: Payer: PRIVATE HEALTH INSURANCE

## 2013-08-29 ENCOUNTER — Ambulatory Visit: Payer: PRIVATE HEALTH INSURANCE | Admitting: Physician Assistant

## 2014-03-29 ENCOUNTER — Other Ambulatory Visit: Payer: Self-pay | Admitting: Internal Medicine

## 2014-03-29 DIAGNOSIS — Z853 Personal history of malignant neoplasm of breast: Secondary | ICD-10-CM

## 2014-05-06 ENCOUNTER — Ambulatory Visit
Admission: RE | Admit: 2014-05-06 | Discharge: 2014-05-06 | Disposition: A | Discharge status: Home / Self Care | Payer: PRIVATE HEALTH INSURANCE | Source: Ambulatory Visit | Attending: Internal Medicine | Admitting: Internal Medicine

## 2014-05-06 ENCOUNTER — Encounter (INDEPENDENT_AMBULATORY_CARE_PROVIDER_SITE_OTHER): Payer: Self-pay

## 2014-05-06 DIAGNOSIS — Z853 Personal history of malignant neoplasm of breast: Secondary | ICD-10-CM

## 2014-11-27 ENCOUNTER — Other Ambulatory Visit (HOSPITAL_COMMUNITY)
Admission: RE | Admit: 2014-11-27 | Discharge: 2014-11-27 | Disposition: A | Discharge status: Home / Self Care | Payer: PRIVATE HEALTH INSURANCE | Source: Ambulatory Visit | Attending: Obstetrics and Gynecology | Admitting: Obstetrics and Gynecology

## 2014-11-27 ENCOUNTER — Other Ambulatory Visit: Payer: Self-pay | Admitting: Obstetrics and Gynecology

## 2014-11-27 DIAGNOSIS — Z01419 Encounter for gynecological examination (general) (routine) without abnormal findings: Secondary | ICD-10-CM | POA: Insufficient documentation

## 2014-11-27 DIAGNOSIS — Z1151 Encounter for screening for human papillomavirus (HPV): Secondary | ICD-10-CM | POA: Diagnosis present

## 2014-11-28 LAB — CYTOLOGY - PAP

## 2015-01-16 ENCOUNTER — Encounter: Payer: Self-pay | Admitting: Internal Medicine

## 2015-04-21 ENCOUNTER — Other Ambulatory Visit: Payer: Self-pay | Admitting: Internal Medicine

## 2015-04-21 DIAGNOSIS — Z853 Personal history of malignant neoplasm of breast: Secondary | ICD-10-CM

## 2015-05-13 ENCOUNTER — Ambulatory Visit
Admission: RE | Admit: 2015-05-13 | Discharge: 2015-05-13 | Disposition: A | Discharge status: Home / Self Care | Payer: PRIVATE HEALTH INSURANCE | Source: Ambulatory Visit | Attending: Internal Medicine | Admitting: Internal Medicine

## 2015-05-13 DIAGNOSIS — Z853 Personal history of malignant neoplasm of breast: Secondary | ICD-10-CM

## 2016-04-13 ENCOUNTER — Other Ambulatory Visit: Payer: Self-pay | Admitting: Internal Medicine

## 2016-04-13 DIAGNOSIS — Z853 Personal history of malignant neoplasm of breast: Secondary | ICD-10-CM

## 2016-04-23 ENCOUNTER — Encounter: Payer: Self-pay | Admitting: Internal Medicine

## 2016-05-14 ENCOUNTER — Ambulatory Visit
Admission: RE | Admit: 2016-05-14 | Discharge: 2016-05-14 | Disposition: A | Discharge status: Home / Self Care | Payer: PRIVATE HEALTH INSURANCE | Source: Ambulatory Visit | Attending: Internal Medicine | Admitting: Internal Medicine

## 2016-05-14 DIAGNOSIS — Z853 Personal history of malignant neoplasm of breast: Secondary | ICD-10-CM

## 2017-04-05 ENCOUNTER — Other Ambulatory Visit: Payer: Self-pay | Admitting: Internal Medicine

## 2017-04-05 DIAGNOSIS — Z853 Personal history of malignant neoplasm of breast: Secondary | ICD-10-CM

## 2017-05-16 ENCOUNTER — Ambulatory Visit
Admission: RE | Admit: 2017-05-16 | Discharge: 2017-05-16 | Disposition: A | Discharge status: Home / Self Care | Payer: PRIVATE HEALTH INSURANCE | Source: Ambulatory Visit | Attending: Internal Medicine | Admitting: Internal Medicine

## 2017-05-16 DIAGNOSIS — Z853 Personal history of malignant neoplasm of breast: Secondary | ICD-10-CM

## 2017-05-16 HISTORY — DX: Personal history of irradiation: Z92.3

## 2017-06-30 ENCOUNTER — Other Ambulatory Visit: Payer: Self-pay | Admitting: Internal Medicine

## 2017-06-30 DIAGNOSIS — Z8249 Family history of ischemic heart disease and other diseases of the circulatory system: Secondary | ICD-10-CM

## 2017-06-30 DIAGNOSIS — F17200 Nicotine dependence, unspecified, uncomplicated: Secondary | ICD-10-CM

## 2017-08-22 ENCOUNTER — Other Ambulatory Visit (HOSPITAL_COMMUNITY)
Admission: RE | Admit: 2017-08-22 | Discharge: 2017-08-22 | Disposition: A | Discharge status: Home / Self Care | Payer: PRIVATE HEALTH INSURANCE | Source: Ambulatory Visit | Attending: Obstetrics and Gynecology | Admitting: Obstetrics and Gynecology

## 2017-08-22 ENCOUNTER — Other Ambulatory Visit: Payer: Self-pay | Admitting: Obstetrics and Gynecology

## 2017-08-22 DIAGNOSIS — Z124 Encounter for screening for malignant neoplasm of cervix: Secondary | ICD-10-CM | POA: Insufficient documentation

## 2017-08-23 LAB — CYTOLOGY - PAP
DIAGNOSIS: NEGATIVE
HPV: NOT DETECTED

## 2017-08-31 ENCOUNTER — Ambulatory Visit
Admission: RE | Admit: 2017-08-31 | Discharge: 2017-08-31 | Disposition: A | Discharge status: Home / Self Care | Payer: PRIVATE HEALTH INSURANCE | Source: Ambulatory Visit | Attending: Internal Medicine | Admitting: Internal Medicine

## 2017-08-31 DIAGNOSIS — F17200 Nicotine dependence, unspecified, uncomplicated: Secondary | ICD-10-CM

## 2017-08-31 DIAGNOSIS — Z8249 Family history of ischemic heart disease and other diseases of the circulatory system: Secondary | ICD-10-CM

## 2017-12-05 ENCOUNTER — Encounter: Payer: Self-pay | Admitting: Internal Medicine

## 2017-12-14 ENCOUNTER — Other Ambulatory Visit: Payer: Self-pay | Admitting: Internal Medicine

## 2017-12-14 DIAGNOSIS — R911 Solitary pulmonary nodule: Secondary | ICD-10-CM

## 2017-12-26 ENCOUNTER — Other Ambulatory Visit: Payer: Self-pay | Admitting: Internal Medicine

## 2017-12-26 DIAGNOSIS — R1310 Dysphagia, unspecified: Secondary | ICD-10-CM

## 2018-02-03 ENCOUNTER — Other Ambulatory Visit: Payer: PRIVATE HEALTH INSURANCE

## 2018-02-03 ENCOUNTER — Ambulatory Visit
Admission: RE | Admit: 2018-02-03 | Discharge: 2018-02-03 | Disposition: A | Discharge status: Home / Self Care | Payer: PRIVATE HEALTH INSURANCE | Source: Ambulatory Visit | Attending: Internal Medicine | Admitting: Internal Medicine

## 2018-02-03 DIAGNOSIS — R911 Solitary pulmonary nodule: Secondary | ICD-10-CM

## 2018-02-03 MED ORDER — IOPAMIDOL (ISOVUE-300) INJECTION 61%
75.0000 mL | Freq: Once | INTRAVENOUS | Status: AC | PRN
  Administered 2018-02-03: 75 mL via INTRAVENOUS

## 2018-02-10 ENCOUNTER — Ambulatory Visit (AMBULATORY_SURGERY_CENTER): Payer: Self-pay

## 2018-02-10 VITALS — Ht 64.0 in | Wt 222.2 lb

## 2018-02-10 DIAGNOSIS — Z86010 Personal history of colonic polyps: Secondary | ICD-10-CM

## 2018-02-10 MED ORDER — NA SULFATE-K SULFATE-MG SULF 17.5-3.13-1.6 GM/177ML PO SOLN: 1.0000 | Freq: Once | ORAL | 0 refills | Status: AC

## 2018-02-10 NOTE — Progress Notes (Signed)
Denies allergies to eggs or soy products. Denies complication of anesthesia or sedation. Denies use of weight loss medication. Denies use of O2.   Emmi instructions declined.  

## 2018-02-17 ENCOUNTER — Ambulatory Visit
Admission: RE | Admit: 2018-02-17 | Discharge: 2018-02-17 | Disposition: A | Discharge status: Home / Self Care | Payer: PRIVATE HEALTH INSURANCE | Source: Ambulatory Visit | Attending: Internal Medicine | Admitting: Internal Medicine

## 2018-02-17 DIAGNOSIS — R1310 Dysphagia, unspecified: Secondary | ICD-10-CM

## 2018-02-24 ENCOUNTER — Ambulatory Visit (AMBULATORY_SURGERY_CENTER): Payer: PRIVATE HEALTH INSURANCE | Admitting: Internal Medicine

## 2018-02-24 ENCOUNTER — Encounter: Payer: Self-pay | Admitting: Internal Medicine

## 2018-02-24 VITALS — BP 154/91 | HR 86 | Temp 98.4°F | Resp 14 | Ht 64.0 in | Wt 220.0 lb

## 2018-02-24 DIAGNOSIS — D125 Benign neoplasm of sigmoid colon: Secondary | ICD-10-CM

## 2018-02-24 DIAGNOSIS — Z86010 Personal history of colonic polyps: Secondary | ICD-10-CM

## 2018-02-24 DIAGNOSIS — D123 Benign neoplasm of transverse colon: Secondary | ICD-10-CM | POA: Diagnosis not present

## 2018-02-24 DIAGNOSIS — K635 Polyp of colon: Secondary | ICD-10-CM | POA: Diagnosis not present

## 2018-02-24 DIAGNOSIS — D122 Benign neoplasm of ascending colon: Secondary | ICD-10-CM

## 2018-02-24 DIAGNOSIS — D124 Benign neoplasm of descending colon: Secondary | ICD-10-CM | POA: Diagnosis not present

## 2018-02-24 DIAGNOSIS — D120 Benign neoplasm of cecum: Secondary | ICD-10-CM

## 2018-02-24 NOTE — Patient Instructions (Signed)
Thank you for allowing [place] to care for you today!  Await pathology results by mail 2 weeks.  Next colonoscopy in 3 years for surveillance.  Resume previous diet and medications as before.  Resume to normal activities tomorrow.     YOU HAD AN ENDOSCOPIC PROCEDURE TODAY AT [facility]:   Refer to the procedure report that was given to you for any specific questions about what was found during the examination.  If the procedure report does not answer your questions, please call your gastroenterologist to clarify.  If you requested that your care partner not be given the details of your procedure findings, then the procedure report has been included in a sealed envelope for you to review at your convenience later.  YOU SHOULD EXPECT: Some feelings of bloating in the abdomen. Passage of more gas than usual.  Walking can help get rid of the air that was put into your GI tract during the procedure and reduce the bloating. If you had a lower endoscopy (such as a colonoscopy or flexible sigmoidoscopy) you may notice spotting of blood in your stool or on the toilet paper. If you underwent a bowel prep for your procedure, you may not have a normal bowel movement for a few days.  Please Note:  You might notice some irritation and congestion in your nose or some drainage.  This is from the oxygen used during your procedure.  There is no need for concern and it should clear up in a day or so.  SYMPTOMS TO REPORT IMMEDIATELY:   Following lower endoscopy (colonoscopy or flexible sigmoidoscopy):  Excessive amounts of blood in the stool  Significant tenderness or worsening of abdominal pains  Swelling of the abdomen that is new, acute  Fever of 100F or higher   For urgent or emergent issues, a gastroenterologist can be reached at any hour by calling [phone].   DIET:  We do recommend a small meal at first, but then you may proceed to your regular diet.  Drink plenty of fluids but  you should avoid alcoholic beverages for 24 hours.  ACTIVITY:  You should plan to take it easy for the rest of today and you should NOT DRIVE or use heavy machinery until tomorrow (because of the sedation medicines used during the test).    FOLLOW UP: Our staff will call the number listed on your records the next business day following your procedure to check on you and address any questions or concerns that you may have regarding the information given to you following your procedure. If we do not reach you, we will leave a message.  However, if you are feeling well and you are not experiencing any problems, there is no need to return our call.  We will assume that you have returned to your regular daily activities without incident.  If any biopsies were taken you will be contacted by phone or by letter within the next 1-3 weeks.  Please call us at [phone] if you have not heard about the biopsies in 3 weeks.    SIGNATURES/CONFIDENTIALITY: You and/or your care partner have signed paperwork which will be entered into your electronic medical record.  These signatures attest to the fact that that the information above on your After Visit Summary has been reviewed and is understood.  Full responsibility of the confidentiality of this discharge information lies with you and/or your care-partner.

## 2018-02-24 NOTE — Progress Notes (Signed)
Pt's states no medical or surgical changes since previsit or office visit. 

## 2018-02-24 NOTE — Op Note (Signed)
[facility] Patient Name: Kristen Gibson Procedure Date: 02/24/2018 9:31 AM MRN: [id number] Endoscopist: Docia Chuck. Henrene Pastor , MD Age: 62 Referring MD:  Date of Birth: September 09, 1955 Gender: Female Account #: [id number] Procedure:                Colonoscopy, with cold snare polypectomy x 9 Indications:              High risk colon cancer surveillance: Personal                            history of multiple (3 or more) adenomas. Previous                            examinations 2006 in 2012. Overdue for follow-up Medicines:                Monitored Anesthesia Care Procedure:                Pre-Anesthesia Assessment:                           - Prior to the procedure, a History and Physical                            was performed, and patient medications and                            allergies were reviewed. The patient's tolerance of                            previous anesthesia was also reviewed. The risks                            and benefits of the procedure and the sedation                            options and risks were discussed with the patient.                            All questions were answered, and informed consent                            was obtained. Prior Anticoagulants: The patient has                            taken no previous anticoagulant or antiplatelet                            agents. ASA Grade Assessment: II - A patient with                            mild systemic disease. After reviewing the risks                            and benefits, the patient was deemed in  satisfactory condition to undergo the procedure.                           After obtaining informed consent, the colonoscope                            was passed under direct vision. Throughout the                            procedure, the patient's blood pressure, pulse, and                            oxygen saturations were monitored continuously. The                     Colonoscope was introduced through the anus and                            advanced to the the cecum, identified by                            appendiceal orifice and ileocecal valve. The                            ileocecal valve, appendiceal orifice, and rectum                            were photographed. The quality of the bowel                            preparation was excellent. The colonoscopy was                            performed without difficulty. The patient tolerated                            the procedure well. The bowel preparation used was                            SUPREP. Scope In: 6:81:15 AM Scope Out: 10:19:14 AM Scope Withdrawal Time: 0 hours 26 minutes 58 seconds  Total Procedure Duration: 0 hours 33 minutes 5 seconds  Findings:                 Nine polyps were found in the sigmoid colon,                            descending colon, transverse colon, ascending colon                            and cecum. The polyps were 3 to 12 mm in size.                            These polyps were removed with a cold snare.  Resection and retrieval were complete.                           A few medium-mouthed diverticula were found in the                            sigmoid colon and ascending colon.                           The exam was otherwise without abnormality on                            direct and retroflexion views. Complications:            No immediate complications. Estimated blood loss:                            None. Estimated Blood Loss:     Estimated blood loss: none. Impression:               - Nine 3 to 12 mm polyps in the sigmoid colon, in                            the descending colon, in the transverse colon, in                            the ascending colon and in the cecum, removed with                            a cold snare. Resected and retrieved.                           - Diverticulosis in the sigmoid  colon and in the                            ascending colon.                           - The examination was otherwise normal on direct                            and retroflexion views. Recommendation:           - Repeat colonoscopy in 3 years for surveillance.                           - Patient has a contact number available for                            emergencies. The signs and symptoms of potential                            delayed complications were discussed with the                            patient.  Return to normal activities tomorrow.                            Written discharge instructions were provided to the                            patient.                           - Resume previous diet.                           - Continue present medications.                           - Await pathology results. Docia Chuck. Henrene Pastor, MD 02/24/2018 10:32:15 AM This report has been signed electronically.

## 2018-02-24 NOTE — Progress Notes (Signed)
Alert and oriented x3, pleased with MAC, report to RN  

## 2018-02-24 NOTE — Progress Notes (Signed)
Called to room to assist during endoscopic procedure.  Patient ID and intended procedure confirmed with present staff. Received instructions for my participation in the procedure from the performing physician.  

## 2018-02-27 ENCOUNTER — Telehealth: Payer: Self-pay | Admitting: *Deleted

## 2018-02-27 NOTE — Telephone Encounter (Signed)

## 2018-03-01 ENCOUNTER — Encounter: Payer: Self-pay | Admitting: Internal Medicine

## 2018-03-17 ENCOUNTER — Other Ambulatory Visit: Payer: Self-pay | Admitting: Internal Medicine

## 2018-03-17 DIAGNOSIS — R911 Solitary pulmonary nodule: Secondary | ICD-10-CM

## 2018-04-11 ENCOUNTER — Other Ambulatory Visit: Payer: Self-pay | Admitting: Internal Medicine

## 2018-04-11 DIAGNOSIS — Z1231 Encounter for screening mammogram for malignant neoplasm of breast: Secondary | ICD-10-CM

## 2018-05-22 ENCOUNTER — Ambulatory Visit
Admission: RE | Admit: 2018-05-22 | Discharge: 2018-05-22 | Disposition: A | Discharge status: Home / Self Care | Payer: PRIVATE HEALTH INSURANCE | Source: Ambulatory Visit | Attending: Internal Medicine | Admitting: Internal Medicine

## 2018-05-22 DIAGNOSIS — Z1231 Encounter for screening mammogram for malignant neoplasm of breast: Secondary | ICD-10-CM

## 2018-07-24 ENCOUNTER — Ambulatory Visit: Payer: PRIVATE HEALTH INSURANCE | Admitting: Cardiology

## 2018-07-25 ENCOUNTER — Encounter: Payer: Self-pay | Admitting: Cardiology

## 2018-07-25 ENCOUNTER — Other Ambulatory Visit: Payer: Self-pay | Admitting: Cardiology

## 2018-07-25 DIAGNOSIS — I2510 Atherosclerotic heart disease of native coronary artery without angina pectoris: Secondary | ICD-10-CM

## 2018-07-25 DIAGNOSIS — I2584 Coronary atherosclerosis due to calcified coronary lesion: Principal | ICD-10-CM

## 2018-07-25 DIAGNOSIS — R0609 Other forms of dyspnea: Secondary | ICD-10-CM

## 2018-08-11 ENCOUNTER — Other Ambulatory Visit: Payer: Self-pay

## 2018-08-21 ENCOUNTER — Ambulatory Visit: Payer: Self-pay | Admitting: Cardiology

## 2018-08-22 ENCOUNTER — Telehealth: Payer: Self-pay

## 2018-09-15 ENCOUNTER — Other Ambulatory Visit: Payer: Self-pay

## 2018-09-15 ENCOUNTER — Ambulatory Visit: Payer: PRIVATE HEALTH INSURANCE

## 2018-09-15 DIAGNOSIS — I2510 Atherosclerotic heart disease of native coronary artery without angina pectoris: Secondary | ICD-10-CM | POA: Diagnosis not present

## 2018-09-15 DIAGNOSIS — R0609 Other forms of dyspnea: Secondary | ICD-10-CM

## 2018-09-15 DIAGNOSIS — I2584 Coronary atherosclerosis due to calcified coronary lesion: Principal | ICD-10-CM

## 2018-09-28 ENCOUNTER — Telehealth: Payer: Self-pay

## 2018-09-28 NOTE — Telephone Encounter (Signed)
lvm for pt.  °

## 2019-02-19 ENCOUNTER — Other Ambulatory Visit: Payer: Self-pay | Admitting: Internal Medicine

## 2019-02-19 DIAGNOSIS — Z1231 Encounter for screening mammogram for malignant neoplasm of breast: Secondary | ICD-10-CM

## 2019-05-25 ENCOUNTER — Ambulatory Visit
Admission: RE | Admit: 2019-05-25 | Discharge: 2019-05-25 | Disposition: A | Discharge status: Home / Self Care | Payer: PRIVATE HEALTH INSURANCE | Source: Ambulatory Visit | Attending: Internal Medicine | Admitting: Internal Medicine

## 2019-05-25 ENCOUNTER — Other Ambulatory Visit: Payer: Self-pay

## 2019-05-25 DIAGNOSIS — Z1231 Encounter for screening mammogram for malignant neoplasm of breast: Secondary | ICD-10-CM

## 2019-05-29 ENCOUNTER — Other Ambulatory Visit: Payer: Self-pay | Admitting: Internal Medicine

## 2019-05-29 DIAGNOSIS — R928 Other abnormal and inconclusive findings on diagnostic imaging of breast: Secondary | ICD-10-CM

## 2019-06-06 ENCOUNTER — Ambulatory Visit
Admission: RE | Admit: 2019-06-06 | Discharge: 2019-06-06 | Disposition: A | Discharge status: Home / Self Care | Payer: PRIVATE HEALTH INSURANCE | Source: Ambulatory Visit | Attending: Internal Medicine | Admitting: Internal Medicine

## 2019-06-06 ENCOUNTER — Other Ambulatory Visit: Payer: Self-pay | Admitting: Internal Medicine

## 2019-06-06 ENCOUNTER — Other Ambulatory Visit: Payer: Self-pay

## 2019-06-06 DIAGNOSIS — R928 Other abnormal and inconclusive findings on diagnostic imaging of breast: Secondary | ICD-10-CM

## 2019-06-06 DIAGNOSIS — N6320 Unspecified lump in the left breast, unspecified quadrant: Secondary | ICD-10-CM

## 2019-06-13 ENCOUNTER — Encounter: Payer: Self-pay | Admitting: Adult Health

## 2019-06-13 DIAGNOSIS — C50412 Malignant neoplasm of upper-outer quadrant of left female breast: Secondary | ICD-10-CM | POA: Insufficient documentation

## 2019-06-13 DIAGNOSIS — Z170 Estrogen receptor positive status [ER+]: Secondary | ICD-10-CM | POA: Insufficient documentation

## 2019-06-15 ENCOUNTER — Other Ambulatory Visit: Payer: Self-pay | Admitting: Surgery

## 2019-06-15 DIAGNOSIS — C50912 Malignant neoplasm of unspecified site of left female breast: Secondary | ICD-10-CM

## 2019-06-18 ENCOUNTER — Telehealth: Payer: Self-pay | Admitting: Radiation Oncology

## 2019-06-18 ENCOUNTER — Telehealth: Payer: Self-pay | Admitting: Oncology

## 2019-06-18 NOTE — Progress Notes (Signed)
[name]  [phone] [phone]     ID: TOYIA JELINEK DOB: 1955/09/02  MR#: [id number]  CLE#:[id number]  Patient Care Team: Shon Baton, MD as PCP - General (Internal Medicine) Coralie Keens, MD as Consulting Physician (General Surgery) Ameli Sangiovanni, Virgie Dad, MD as Consulting Physician (Oncology) [name], MD as Consulting Physician (Radiation Oncology) Chauncey Cruel, MD OTHER MD:  CHIEF COMPLAINT: Lateral estrogen receptor positive breast cancers  CURRENT TREATMENT: Awaiting definitive surgery   HISTORY OF CURRENT ILLNESS: Kristen Gibson has a history of right breast cancer detailed below.  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  She was prescribed tamoxifen but never took it regularly or perhaps at all and after the June 2014 visit here she was lost to follow-up.   More recently she had routine screening mammography on 05/25/2019 showing a possible abnormality in the left breast. She underwent left diagnostic mammography with tomography and left breast ultrasonography at [facility] on 06/06/2019 showing: breast density category B; indeterminate 4 mm mass in the left breast at 2 o'clock; no abnormal left axillary lymph nodes.  Accordingly on 06/06/2019 she proceeded to biopsy of the left breast area in question. The pathology from this procedure ([id number]) showed: invasive ductal carcinoma, grade 1, with ductal carcinoma in situ. Prognostic indicators significant for: estrogen receptor, 95% positive and progesterone receptor, 95% positive, both with strong staining intensity. Proliferation marker Ki67 at 10%. HER2 equivocal by immunohistochemistry (2+), but negative by fluorescent in situ hybridization with a signals ratio 1.24 and number per cell 2.05.  The patient's subsequent history is as detailed below.   INTERVAL HISTORY: [name]  was evaluated in the breast cancer clinic on 06/18/60. Her case was also presented at the multidisciplinary breast cancer conference on 06/13/2019 at that time a preliminary plan was proposed: Genetics testing, breast conserving surgery, adjuvant radiation, antiestrogens   REVIEW OF SYSTEMS: There were no specific symptoms leading to the original mammogram, which was routinely scheduled. The patient denies unusual headaches, visual changes, nausea, vomiting, stiff neck, dizziness, or gait imbalance. There has been no cough, phlegm production, or pleurisy, no chest pain or pressure, and no change in bowel or bladder habits. The patient denies fever, rash, bleeding, unexplained fatigue or unexplained weight loss.  She admits to a little bit of arthritis here and there and some shortness of breath with significant exertion.  A detailed review of systems was otherwise entirely negative.   History of right sided breast cancer: The patient had been monitored with short interval mammography over two years for unexplained bruising of the right breast. In October 2012  a small spiculated mass was noted in the outer portion of the right breast posteriorly.  Ultrasound confirmed a small irregularly-marginated lesion at the 9 o'clock position 9 cm from the right nipple measuring 4 x 4 x 5 mm.  The Right axilla was unremarkable. Ultrasound-guided core biopsy on 04/12/2011 revealed a low-grade ductal carcinoma that was estrogen receptor positive at 60%, progesterone receptor positive at 8%, Ki-67 was 5% and HER2/neu negative with a ratio by CISH of 1.29.  Subseuently bilateral breast MRI showed a 9 x 8 x 6 mm mass deep in the upper outer quadrant of the right breast.  No other abnormalities were noted. The patient underwent definitive Right lumpectomy and sentinel lymph node sampling 05/13/2011 with results and subsequent history as noted below.   PAST MEDICAL HISTORY: Past  Medical History:  Diagnosis Date  .  Allergy   . Anxiety    no medications for anxiety  . Arthritis   . Breast cancer ([place])    2012  . Cancer ([place]) 05/2011   right breast  . GERD (gastroesophageal reflux disease)   . Hypothyroid   . Personal history of radiation therapy    2012 right breast  . Psoriasis   . S/P radiation therapy 06/17/2011 - 08/04/2011   Right Breast - 45 Gy/25 fractions whole breast, and 16 Gy/8 fractions for boost    PAST SURGICAL HISTORY: Past Surgical History:  Procedure Laterality Date  . BREAST LUMPECTOMY Right    2012  . BREAST SURGERY  05/13/11   Rt Breast Lumpectomy with sentinel node biospy, invasive ductal ca, 0/2 nodes   . TONSILLECTOMY    . TUBAL LIGATION     At age 63    FAMILY HISTORY: Family History  Problem Relation Age of Onset  . Cancer Mother        kidney  . Kidney cancer Mother   . Cancer Maternal Grandmother        colon  . Colon cancer Maternal Grandmother   . Cancer Paternal Grandmother        colon  . Colon cancer Paternal Grandmother   . Diabetes Neg Hx   . Rectal cancer Neg Hx   . Stomach cancer Neg Hx    The patient's father died with significant dementia at age 58..  Mother died at the age of 52  from kidney cancer.The patient had no sisters. One brother is alive and well as of December 2020.  The patient's maternal grandmother had colon cancer, diagnosed at unknown age, and paternal grandmother had colon cancer diagnosed at age 49. There is no history of breast or ovarian cancer in the family to her knowledge.   GYNECOLOGIC HISTORY:  Patient's last menstrual period was 10/08/2010. Menarche: 63 years old [place] P 0 LMP 61/2012 Contraceptive briefly HRT no  Hysterectomy? no BSO? 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  She lives alone, her dog having recently died.  The patient's brother works in a warehouse, third shift.    ADVANCED DIRECTIVES: Patient's brother [name] is her  healthcare power of attorney.  He can be reached at [phone].   HEALTH MAINTENANCE: Social History   Tobacco Use  . Smoking status: Former Smoker    Packs/day: 0.30    Years: 30.00    Pack years: 9.00    Types: Cigarettes    Quit date: 05/09/2009    Years since quitting: 10.1  . Smokeless tobacco: Never Used  Substance Use Topics  . Alcohol use: No  . Drug use: No     Colonoscopy: 02/2018 (Dr. Henrene Pastor), repeated every 3 years  PAP: 08/2017, negative  Bone density:    No Known Allergies  Current Outpatient Medications  Medication Sig Dispense Refill  . ALPRAZolam (XANAX) 0.5 MG tablet Take 0.25 mg by mouth at bedtime as needed.    . celecoxib (CELEBREX) 200 MG capsule Take 200 mg by mouth 2 (two) times daily.    . Cholecalciferol (VITAMIN D) 1000 UNITS capsule Take 1,000 Units by mouth daily.      Marland Kitchen FINACEA 15 % cream     . levothyroxine (SYNTHROID, LEVOTHROID) 137 MCG tablet TAKE 1 TABLET BY MOUTH EVERY DAY 90 tablet 0  . minocycline (MINOCIN,DYNACIN) 100 MG capsule  Take 100 mg by mouth daily as needed. For rosacea     . OVER THE COUNTER MEDICATION Vitamin B 12 5000 mcg two tablets daily.    Marland Kitchen ascorbic acid (VITAMIN C) 500 MG tablet Take 500 mg by mouth daily.      . tamoxifen (NOLVADEX) 20 MG tablet Take 1 tablet (20 mg total) by mouth daily. (Patient not taking: Reported on 02/24/2018) 90 tablet 12   No current facility-administered medications for this visit.    OBJECTIVE:   Vitals:   06/19/19 1549  BP: 140/69  Pulse: 71  Resp: 18  Temp: 98.3 F (36.8 C)  SpO2: 100%     Body mass index is 37.76 kg/m.   Wt Readings from Last 3 Encounters:  06/19/19 220 lb (99.8 kg)  07/13/18 229 lb (103.9 kg)  02/24/18 220 lb (99.8 kg)      ECOG FS:1 - Symptomatic but completely ambulatory  Ocular: Sclerae unicteric, pupils round and equal Ear-nose-throat: Wearing a mask Lymphatic: No cervical or supraclavicular adenopathy Lungs no rales or rhonchi Heart regular rate  and rhythm Abd soft, nontender, positive bowel sounds MSK no focal spinal tenderness, no joint edema Neuro: non-focal, well-oriented, appropriate affect Breasts: The right breast is status post remote lumpectomy and radiation.  There is no evidence of disease recurrence.  The left breast is status post recent biopsy.  There is no ecchymosis or palpable mass.  There are no skin or nipple changes of concern.  Both axillae are benign.  LAB RESULTS:  CMP     Component Value Date/Time   NA 141 06/19/2019 1524   NA 142 12/12/2012 0847   K 4.0 06/19/2019 1524   K 4.1 12/12/2012 0847   CL 107 06/19/2019 1524   CL 106 12/12/2012 0847   CO2 23 06/19/2019 1524   CO2 26 12/12/2012 0847   GLUCOSE 123 (H) 06/19/2019 1524   GLUCOSE 128 (H) 12/12/2012 0847   BUN 18 06/19/2019 1524   BUN 11.2 12/12/2012 0847   CREATININE 0.78 06/19/2019 1524   CREATININE 0.9 12/12/2012 0847   CALCIUM 8.9 06/19/2019 1524   CALCIUM 8.8 12/12/2012 0847   PROT 6.7 06/19/2019 1524   PROT 6.7 12/12/2012 0847   ALBUMIN 3.7 06/19/2019 1524   ALBUMIN 3.3 (L) 12/12/2012 0847   AST 14 (L) 06/19/2019 1524   AST 12 12/12/2012 0847   ALT 13 06/19/2019 1524   ALT 13 12/12/2012 0847   ALKPHOS 112 06/19/2019 1524   ALKPHOS 83 12/12/2012 0847   BILITOT 0.3 06/19/2019 1524   BILITOT 0.46 12/12/2012 0847   GFRNONAA >60 06/19/2019 1524   GFRAA >60 06/19/2019 1524    No results found for: TOTALPROTELP, ALBUMINELP, A1GS, A2GS, BETS, BETA2SER, GAMS, MSPIKE, SPEI  No results found for: KPAFRELGTCHN, LAMBDASER, KAPLAMBRATIO  Lab Results  Component Value Date   WBC 11.1 (H) 06/19/2019   NEUTROABS 7.7 06/19/2019   HGB 13.7 06/19/2019   HCT 41.0 06/19/2019   MCV 93.0 06/19/2019   PLT 273 06/19/2019    Lab Results  Component Value Date   LABCA2 20 04/21/2011    No components found for: [id number]  No results for input(s): INR in the last 168 hours.  Lab Results  Component Value Date   LABCA2 20 04/21/2011     No results found for: PXT062  No results found for: IRS854  No results found for: OEV035  No results found for: CA2729  No components found for: HGQUANT  No results found for: CEA1 /  No results found for: CEA1   No results found for: AFPTUMOR  No results found for: [place]  No results found for: PSA1  Appointment on 06/19/2019  Component Date Value Ref Range Status  . Sodium 06/19/2019 141  135 - 145 mmol/L Final  . Potassium 06/19/2019 4.0  3.5 - 5.1 mmol/L Final  . Chloride 06/19/2019 107  98 - 111 mmol/L Final  . CO2 06/19/2019 23  22 - 32 mmol/L Final  . Glucose, Bld 06/19/2019 123* 70 - 99 mg/dL Final  . BUN 06/19/2019 18  8 - 23 mg/dL Final  . Creatinine 06/19/2019 0.78  0.44 - 1.00 mg/dL Final  . Calcium 06/19/2019 8.9  8.9 - 10.3 mg/dL Final  . Total Protein 06/19/2019 6.7  6.5 - 8.1 g/dL Final  . Albumin 06/19/2019 3.7  3.5 - 5.0 g/dL Final  . AST 06/19/2019 14* 15 - 41 U/L Final  . ALT 06/19/2019 13  0 - 44 U/L Final  . Alkaline Phosphatase 06/19/2019 112  38 - 126 U/L Final  . Total Bilirubin 06/19/2019 0.3  0.3 - 1.2 mg/dL Final  . GFR, Est Non Af Am 06/19/2019 >60  >60 mL/min Final  . GFR, Est AFR Am 06/19/2019 >60  >60 mL/min Final  . Anion gap 06/19/2019 11  5 - 15 Final   Performed at [facility], [place] [address], [place], [place] [address]  . WBC Count 06/19/2019 11.1* 4.0 - 10.5 K/uL Final  . RBC 06/19/2019 4.41  3.87 - 5.11 MIL/uL Final  . Hemoglobin 06/19/2019 13.7  12.0 - 15.0 g/dL Final  . HCT 06/19/2019 41.0  36.0 - 46.0 % Final  . MCV 06/19/2019 93.0  80.0 - 100.0 fL Final  . MCH 06/19/2019 31.1  26.0 - 34.0 pg Final  . MCHC 06/19/2019 33.4  30.0 - 36.0 g/dL Final  . RDW 06/19/2019 12.9  11.5 - 15.5 % Final  . Platelet Count 06/19/2019 273  150 - 400 K/uL Final  . nRBC 06/19/2019 0.0  0.0 - 0.2 % Final  . Neutrophils Relative % 06/19/2019 70  % Final  . Neutro Abs 06/19/2019 7.7  1.7 - 7.7 K/uL Final  .  Lymphocytes Relative 06/19/2019 22  % Final  . Lymphs Abs 06/19/2019 2.4  0.7 - 4.0 K/uL Final  . Monocytes Relative 06/19/2019 5  % Final  . Monocytes Absolute 06/19/2019 0.6  0.1 - 1.0 K/uL Final  . Eosinophils Relative 06/19/2019 2  % Final  . Eosinophils Absolute 06/19/2019 0.3  0.0 - 0.5 K/uL Final  . Basophils Relative 06/19/2019 1  % Final  . Basophils Absolute 06/19/2019 0.1  0.0 - 0.1 K/uL Final  . Immature Granulocytes 06/19/2019 0  % Final  . Abs Immature Granulocytes 06/19/2019 0.03  0.00 - 0.07 K/uL Final   Performed at [facility], [place] [address], [place], [place] [address]    (this displays the last labs from the last 3 days)  No results found for: TOTALPROTELP, ALBUMINELP, A1GS, A2GS, BETS, BETA2SER, GAMS, MSPIKE, SPEI (this displays SPEP labs)  No results found for: KPAFRELGTCHN, LAMBDASER, KAPLAMBRATIO (kappa/lambda light chains)  No results found for: HGBA, HGBA2QUANT, HGBFQUANT, HGBSQUAN (Hemoglobinopathy evaluation)   No results found for: LDH  No results found for: IRON, TIBC, IRONPCTSAT (Iron and TIBC)  No results found for: FERRITIN  Urinalysis No results found for: COLORURINE, APPEARANCEUR, LABSPEC, PHURINE, GLUCOSEU, HGBUR, BILIRUBINUR, KETONESUR, PROTEINUR, UROBILINOGEN, NITRITE, LEUKOCYTESUR   STUDIES: US BREAST LTD  UNI LEFT INC AXILLA  Addendum Date: 06/07/2019   ADDENDUM REPORT: 06/07/2019 11:58 ADDENDUM: Addendum for addition of findings. The left axilla was scanned. Normal appearing left axillary lymph node identified. No abnormal left axillary lymph nodes. Electronically Signed   By: Lovey Newcomer M.D.   On: 06/07/2019 11:58   Result Date: 06/07/2019 CLINICAL DATA:  Patient recalled from screening for left breast mass. EXAM: DIGITAL DIAGNOSTIC LEFT MAMMOGRAM WITH CAD AND TOMO ULTRASOUND LEFT BREAST COMPARISON:  Previous exam(s). ACR Breast Density Category b: There are scattered areas of fibroglandular density. FINDINGS:  There is a persistent mildly irregular mass within the upper-outer left breast anterior depth, further evaluated spot compression CC and MLO tomosynthesis images. Mammographic images were processed with CAD. On physical exam, no discrete mass is palpated within the upper-outer left breast. Targeted ultrasound is performed, showing a 4 x 4 x 3 mm irregular hypoechoic mass left breast 2 o'clock position 5 cm from nipple. IMPRESSION: Indeterminate left breast mass 2 o'clock position. RECOMMENDATION: Ultrasound-guided core needle biopsy left breast mass 2 o'clock position. I have discussed the findings and recommendations with the patient. If applicable, a reminder letter will be sent to the patient regarding the next appointment. BI-RADS CATEGORY  4: Suspicious. Electronically Signed: By: Lovey Newcomer M.D. On: 06/06/2019 09:22   MM DIAG BREAST TOMO UNI LEFT  Addendum Date: 06/07/2019   ADDENDUM REPORT: 06/07/2019 11:58 ADDENDUM: Addendum for addition of findings. The left axilla was scanned. Normal appearing left axillary lymph node identified. No abnormal left axillary lymph nodes. Electronically Signed   By: Lovey Newcomer M.D.   On: 06/07/2019 11:58   Result Date: 06/07/2019 CLINICAL DATA:  Patient recalled from screening for left breast mass. EXAM: DIGITAL DIAGNOSTIC LEFT MAMMOGRAM WITH CAD AND TOMO ULTRASOUND LEFT BREAST COMPARISON:  Previous exam(s). ACR Breast Density Category b: There are scattered areas of fibroglandular density. FINDINGS: There is a persistent mildly irregular mass within the upper-outer left breast anterior depth, further evaluated spot compression CC and MLO tomosynthesis images. Mammographic images were processed with CAD. On physical exam, no discrete mass is palpated within the upper-outer left breast. Targeted ultrasound is performed, showing a 4 x 4 x 3 mm irregular hypoechoic mass left breast 2 o'clock position 5 cm from nipple. IMPRESSION: Indeterminate left breast mass 2 o'clock  position. RECOMMENDATION: Ultrasound-guided core needle biopsy left breast mass 2 o'clock position. I have discussed the findings and recommendations with the patient. If applicable, a reminder letter will be sent to the patient regarding the next appointment. BI-RADS CATEGORY  4: Suspicious. Electronically Signed: By: Lovey Newcomer M.D. On: 06/06/2019 09:22   MM 3D SCREEN BREAST BILATERAL  Result Date: 05/28/2019 CLINICAL DATA:  Screening. EXAM: DIGITAL SCREENING BILATERAL MAMMOGRAM WITH TOMO AND CAD COMPARISON:  Previous exam(s). ACR Breast Density Category a: The breast tissue is almost entirely fatty. FINDINGS: In the left breast, a possible mass warrants further evaluation. In the right breast, no findings suspicious for malignancy. Images were processed with CAD. IMPRESSION: Further evaluation is suggested for possible mass in the left breast. RECOMMENDATION: Diagnostic mammogram and possibly ultrasound of the left breast. (Code:[id number]) The patient will be contacted regarding the findings, and additional imaging will be scheduled. BI-RADS CATEGORY  0: Incomplete. Need additional imaging evaluation and/or prior mammograms for comparison. Electronically Signed   By: Kristopher Oppenheim M.D.   On: 05/28/2019 14:29   MM CLIP PLACEMENT LEFT  Result Date: 06/06/2019 CLINICAL DATA:  Patient is post ultrasound-guided core needle biopsy  of a 4 mm irregular mass over the 2 o'clock position of the left breast. EXAM: DIAGNOSTIC LEFT MAMMOGRAM POST ULTRASOUND BIOPSY COMPARISON:  Previous exam(s). FINDINGS: Mammographic images were obtained following ultrasound guided biopsy of the targeted mass at the 2 o'clock position. The biopsy marking clip is approximately 1 cm superomedial to the expected site of biopsy. IMPRESSION: Satisfactory positioning of the ribbon shaped biopsy marking clip as described at the site of biopsy in the upper-outer left breast. Final Assessment: Post Procedure Mammograms for Marker Placement  Electronically Signed   By: Marin Olp M.D.   On: 06/06/2019 10:42   [place] LT BREAST BX W LOC DEV 1ST LESION IMG BX SPEC US GUIDE  Addendum Date: 06/08/2019   ADDENDUM REPORT: 06/08/2019 07:47 ADDENDUM: Pathology revealed GRADE I INVASIVE DUCTAL CARCINOMA, DUCTAL CARCINOMA IN SITU of the LEFT breast, 2 o'clock, 5 cm from nipple. This was found to be concordant by Dr. Marin Olp. Pathology results were discussed with the patient by telephone. The patient reported doing well after the biopsy with tenderness at the site. Post biopsy instructions and care were reviewed and questions were answered. The patient was encouraged to call [facility] for any additional concerns. Per patient request, surgical consultation has been arranged with Dr. Coralie Keens at [facility] on June 15, 2019. Pathology results reported by Stacie Acres, RN on 06/08/2019. Electronically Signed   By: Marin Olp M.D.   On: 06/08/2019 07:47   Result Date: 06/08/2019 CLINICAL DATA:  Patient presents for ultrasound-guided core needle biopsy of an indeterminate 4 mm left breast mass over the 2 o'clock position 5 cm from the nipple. EXAM: ULTRASOUND GUIDED LEFT BREAST CORE NEEDLE BIOPSY COMPARISON:  Previous exam(s). FINDINGS: I met with the patient and we discussed the procedure of ultrasound-guided biopsy, including benefits and alternatives. We discussed the high likelihood of a successful procedure. We discussed the risks of the procedure, including infection, bleeding, tissue injury, clip migration, and inadequate sampling. Informed written consent was given. The usual time-out protocol was performed immediately prior to the procedure. Lesion quadrant: Left upper outer quadrant. Using sterile technique and 1% Lidocaine as local anesthetic, under direct ultrasound visualization, a 14 gauge spring-loaded device was used to perform biopsy of the targeted mass at the 2 o'clock position using a  inferior to superior approach. Three core tissue specimens were obtained. At the conclusion of the procedure ribbon shaped tissue marker clip was deployed into the biopsy cavity. Follow up 2 view mammogram was performed and dictated separately. IMPRESSION: Ultrasound guided biopsy of an indeterminate 4 mm left breast mass. No apparent complications. Electronically Signed: By: Marin Olp M.D. On: 06/06/2019 10:28    ELIGIBLE FOR AVAILABLE RESEARCH PROTOCOL: no  ASSESSMENT: 63 y.o. [place] woman with Adderall breast cancer history as follows:   (1) status post right lumpectomy and sentinel lymph node biopsy 05/13/2011 for a T1c N0, stage IA invasive ductal carcinoma, grade 1, estrogen receptor 69% and progesterone receptor 84% positive, with an MIB-1 of 6%, and no HER-2 amplification.   (a) completed adjuvant radiation January 2013   (b) tamoxifen started January 2013 and taken irregularly for up to 3 years per patient recollection  (2) status post left breast upper outer quadrant biopsy 06/06/2019 for a clinical T1a N0, stage IA's of ductal carcinoma, grade 1, estrogen and progesterone receptor positive, HER-2 nonamplified, with an MIB-1 of 10%  (3) genetics testing pending  (4) definitive surgery pending  (5) adjuvant radiation  (  6) consider antiestrogens    PLAN: I spent approximately 60 minutes face to face with Emanuel with more than 50% of that time spent in counseling and coordination of care.  We discussed the fact that this cancer is in the opposite breast, there is no connection from one breast to the other, so this is a different breast cancer even though it is a very similar 1 and that both were small and both were estrogen receptor positive.  We discussed the difference between local and systemic therapy. In terms of loco-regional treatment, lumpectomy plus radiation is equivalent to mastectomy as far as survival is concerned. For this reason, and because the cosmetic  results are generally superior, we recommend breast conserving surgery.   We then discussed the rationale for systemic therapy. There is some risk that this cancer may have already spread to other parts of her body. Next we went over the options for systemic therapy which are anti-estrogens, anti-HER-2 immunotherapy, and chemotherapy. Rethel does not meet criteria for anti-HER-2 immunotherapy. She is a good candidate for anti-estrogens.  The question of chemotherapy is more complicated. Chemotherapy is most effective in rapidly growing, aggressive tumors. It is much less effective in low-grade, slow growing cancers, like Shakenya 's.  In addition, we generally do not offer chemotherapy for cancers 5 mm or smaller.  Accordingly at this point we are not planning to send an Oncotype and we are not planning on chemotherapy for this cancer.  Elody understands that the possibility of systemic spread from cancers this is very low, less than 5%.  That is why NCCN guidelines suggest consideration of antiestrogens but do not make an out right recommendation.  However since now she has had cancer twice I think it would be prudent for her to receive antiestrogens for 5 years since that will cut the risk of a third breast cancer developing into the breast in half.  Sherrice also qualifies for genetics testing. In patients who carry a deleterious mutation [for example in a  BRCA gene], the risk of a new breast cancer developing in the future may be sufficiently great that the patient may choose bilateral mastectomies. However if she wishes to keep her breasts in that situation it is safe to do so. That would require intensified screening, which generally means not only yearly mammography but a yearly breast MRI as well.  Mariyah is very clear that if she were found to have a deleterious mutation she would not want bilateral mastectomies but would go for intensified screening instead.  Accordingly there is no need to  postpone surgery until we have the definitive genetics results  The overall plan then is for surgery followed by radiation then consideration of antiestrogens.  Aldora has a good understanding of the overall plan. She agrees with it. She knows the goal of treatment in her case is cure. She will call with any problems that may develop before her next visit here.   Chauncey Cruel, MD   06/19/2019 4:54 PM Medical Oncology and Hematology [facility] [place], [place] [address] Tel. [phone]    Fax. [phone]   This document serves as a record of services personally performed by Lurline Del, MD. It was created on his behalf by Wilburn Mylar, a trained medical scribe. The creation of this record is based on the scribe's personal observations and the provider's statements to them.   Lindie Spruce MD, have reviewed the above documentation for accuracy and completeness,  and I agree with the above.

## 2019-06-18 NOTE — Telephone Encounter (Signed)
New message:   LVM for patient to return call and schedule appt from referral received

## 2019-06-18 NOTE — Telephone Encounter (Signed)
Received a new patient referral from Dr. Lucia Gaskins for a new dx of breast cancer. Kristen Gibson has been cld and scheduled to see Dr. Jana Hakim on 12/15 at 4pm w/labs at 3:30pm. Pt aware to arrive 15 minutes early.

## 2019-06-19 ENCOUNTER — Inpatient Hospital Stay: Payer: PRIVATE HEALTH INSURANCE

## 2019-06-19 ENCOUNTER — Other Ambulatory Visit: Payer: Self-pay | Admitting: *Deleted

## 2019-06-19 ENCOUNTER — Other Ambulatory Visit: Payer: Self-pay

## 2019-06-19 ENCOUNTER — Telehealth: Payer: Self-pay | Admitting: Radiation Oncology

## 2019-06-19 ENCOUNTER — Inpatient Hospital Stay: Payer: PRIVATE HEALTH INSURANCE | Attending: Oncology | Admitting: Oncology

## 2019-06-19 ENCOUNTER — Other Ambulatory Visit: Payer: Self-pay | Admitting: Surgery

## 2019-06-19 VITALS — BP 140/69 | HR 71 | Temp 98.3°F | Resp 18 | Wt 220.0 lb

## 2019-06-19 DIAGNOSIS — Z8051 Family history of malignant neoplasm of kidney: Secondary | ICD-10-CM

## 2019-06-19 DIAGNOSIS — E039 Hypothyroidism, unspecified: Secondary | ICD-10-CM

## 2019-06-19 DIAGNOSIS — Z800 Family history of malignant neoplasm of digestive organs: Secondary | ICD-10-CM

## 2019-06-19 DIAGNOSIS — Z853 Personal history of malignant neoplasm of breast: Secondary | ICD-10-CM

## 2019-06-19 DIAGNOSIS — C50911 Malignant neoplasm of unspecified site of right female breast: Secondary | ICD-10-CM

## 2019-06-19 DIAGNOSIS — Z79899 Other long term (current) drug therapy: Secondary | ICD-10-CM

## 2019-06-19 DIAGNOSIS — Z87891 Personal history of nicotine dependence: Secondary | ICD-10-CM

## 2019-06-19 DIAGNOSIS — Z170 Estrogen receptor positive status [ER+]: Secondary | ICD-10-CM

## 2019-06-19 DIAGNOSIS — C50412 Malignant neoplasm of upper-outer quadrant of left female breast: Secondary | ICD-10-CM

## 2019-06-19 DIAGNOSIS — F419 Anxiety disorder, unspecified: Secondary | ICD-10-CM | POA: Diagnosis not present

## 2019-06-19 DIAGNOSIS — C50912 Malignant neoplasm of unspecified site of left female breast: Secondary | ICD-10-CM

## 2019-06-19 DIAGNOSIS — C50811 Malignant neoplasm of overlapping sites of right female breast: Secondary | ICD-10-CM | POA: Insufficient documentation

## 2019-06-19 DIAGNOSIS — Z923 Personal history of irradiation: Secondary | ICD-10-CM | POA: Diagnosis not present

## 2019-06-19 DIAGNOSIS — Z791 Long term (current) use of non-steroidal anti-inflammatories (NSAID): Secondary | ICD-10-CM | POA: Diagnosis not present

## 2019-06-19 LAB — CBC WITH DIFFERENTIAL (CANCER CENTER ONLY)
Abs Immature Granulocytes: 0.03 10*3/uL (ref 0.00–0.07)
Basophils Absolute: 0.1 10*3/uL (ref 0.0–0.1)
Basophils Relative: 1 %
Eosinophils Absolute: 0.3 10*3/uL (ref 0.0–0.5)
Eosinophils Relative: 2 %
HCT: 41 % (ref 36.0–46.0)
Hemoglobin: 13.7 g/dL (ref 12.0–15.0)
Immature Granulocytes: 0 %
Lymphocytes Relative: 22 %
Lymphs Abs: 2.4 10*3/uL (ref 0.7–4.0)
MCH: 31.1 pg (ref 26.0–34.0)
MCHC: 33.4 g/dL (ref 30.0–36.0)
MCV: 93 fL (ref 80.0–100.0)
Monocytes Absolute: 0.6 10*3/uL (ref 0.1–1.0)
Monocytes Relative: 5 %
Neutro Abs: 7.7 10*3/uL (ref 1.7–7.7)
Neutrophils Relative %: 70 %
Platelet Count: 273 10*3/uL (ref 150–400)
RBC: 4.41 MIL/uL (ref 3.87–5.11)
RDW: 12.9 % (ref 11.5–15.5)
WBC Count: 11.1 10*3/uL — ABNORMAL HIGH (ref 4.0–10.5)
nRBC: 0 % (ref 0.0–0.2)

## 2019-06-19 LAB — CMP (CANCER CENTER ONLY)
ALT: 13 U/L (ref 0–44)
AST: 14 U/L — ABNORMAL LOW (ref 15–41)
Albumin: 3.7 g/dL (ref 3.5–5.0)
Alkaline Phosphatase: 112 U/L (ref 38–126)
Anion gap: 11 (ref 5–15)
BUN: 18 mg/dL (ref 8–23)
CO2: 23 mmol/L (ref 22–32)
Calcium: 8.9 mg/dL (ref 8.9–10.3)
Chloride: 107 mmol/L (ref 98–111)
Creatinine: 0.78 mg/dL (ref 0.44–1.00)
GFR, Est AFR Am: >60 mL/min (ref >60)
GFR, Estimated: >60 mL/min (ref >60)
Glucose, Bld: 123 mg/dL — ABNORMAL HIGH (ref 70–99)
Potassium: 4 mmol/L (ref 3.5–5.1)
Sodium: 141 mmol/L (ref 135–145)
Total Bilirubin: 0.3 mg/dL (ref 0.3–1.2)
Total Protein: 6.7 g/dL (ref 6.5–8.1)

## 2019-06-19 NOTE — Progress Notes (Signed)
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  Receptor Status: ER(95%), PR (95%), Her2-neu (NEG), Ki-(10%)  Did patient present with symptoms or was this found on screening mammography?: It was found on a screening mammogram.   Past/Anticipated interventions by surgeon, if any: 06/15/19 initial consult with Dr. Ninfa Linden Surgery scheduled for 07/12/19  Past/Anticipated interventions by medical oncology, if any:  06/19/19 Dr. Jana Hakim consult.  The overall plan then is for surgery followed by radiation then consideration of antiestrogens  Lymphedema issues, if any:  N/A  Pain issues, if any:  She denies.   SAFETY ISSUES:  Prior radiation? Yes, 06/17/2011 - 08/04/2011 Right Breast - 45 Gy/25 fractions whole breast, and 16 Gy/8 fractions for boost  Pacemaker/ICD? No  Possible current pregnancy? No  Is the patient on methotrexate? No  Current Complaints / other details:      Mihailo Sage, Stephani Police, RN 06/19/2019,10:14 AM

## 2019-06-19 NOTE — Progress Notes (Signed)
Radiation Oncology         [phone] ________________________________  Initial outpatient telephone Consultation by telephone as patient was unable to access MyChart video during pandemic precautions   Name: Kristen Gibson MRN: [id number]  Date: 06/20/2019  DOB: 02/20/1956  BS:JGGEZ, Jenny Reichmann, MD  Coralie Keens, MD   REFERRING PHYSICIAN: Coralie Keens, MD  DIAGNOSIS:    ICD-10-CM   1. Malignant neoplasm of upper-outer quadrant of left breast in female, estrogen receptor positive ([place])  C50.412    Z17.0     Cancer Staging Malignant neoplasm of upper-outer quadrant of left breast in female, estrogen receptor positive ([place]) Staging form: Breast, AJCC 8th Edition - Clinical stage from 06/06/2019: Stage IA (cT1a, cN0, cM0, G1, ER+, PR+, HER2-) - Signed by Gardenia Phlegm, NP on 06/13/2019  CHIEF COMPLAINT: Here to discuss management of left breast cancer  HISTORY OF PRESENT ILLNESS:Kristen Gibson is a 63 y.o. female with a history of right breast cancer diagnosed in 2012 and had received adjuvant RT with Dr Pablo Ledger. She presented with breast abnormality on the following imaging: screening mammogram on the date of 05/25/2019.  Symptoms, if any, at that time, were: none.   Ultrasound of breast on 06/06/2019 revealed 4 mm left breast mass.   Biopsy on date of 06/06/2019 showed invasive ductal carcinoma with DCIS.  ER status: positive; PR status positive, Her2 status negative; Grade 1.  For her initial breast cancer (right) she took tamoxifen for an incomplete course, patient estimates 3 years.  She is scheduled for left breast lumpectomy on 07/12/2019.  She anticipates adjuvant anti estrogens with Dr. Jana Hakim and intends to complete a full course. She is waiting for genetic counseling appt.  She works in [employer] copy.  Denies pain.   PREVIOUS RADIATION THERAPY: Yes  06/17/2011 - 08/04/2011: Right Breast / 61 Gy (Dr. Pablo Ledger)  PAST MEDICAL  HISTORY:  has a past medical history of Allergy, Anxiety, Arthritis, Breast cancer ([place]), Cancer ([place]) (05/2011), GERD (gastroesophageal reflux disease), Hypothyroid, Personal history of radiation therapy, Psoriasis, and S/P radiation therapy (06/17/2011 - 08/04/2011).    PAST SURGICAL HISTORY: Past Surgical History:  Procedure Laterality Date  . BREAST LUMPECTOMY Right    2012  . BREAST SURGERY  05/13/11   Rt Breast Lumpectomy with sentinel node biospy, invasive ductal ca, 0/2 nodes   . TONSILLECTOMY    . TUBAL LIGATION     At age 51    FAMILY HISTORY: family history includes Cancer in her maternal grandmother, mother, and paternal grandmother; Colon cancer in her maternal grandmother and paternal grandmother; Kidney cancer in her mother.  SOCIAL HISTORY:  reports that she quit smoking about 10 years ago. Her smoking use included cigarettes. She has a 9.00 pack-year smoking history. She has never used smokeless tobacco. She reports that she does not drink alcohol or use drugs.  ALLERGIES: Patient has no known allergies.  MEDICATIONS:  Current Outpatient Medications  Medication Sig Dispense Refill  . ALPRAZolam (XANAX) 0.5 MG tablet Take 0.25 mg by mouth at bedtime as needed.    . celecoxib (CELEBREX) 200 MG capsule Take 200 mg by mouth 2 (two) times daily.    . Cholecalciferol (VITAMIN D) 1000 UNITS capsule Take 1,000 Units by mouth daily.      Marland Kitchen FINACEA 15 % cream     . levothyroxine (SYNTHROID, LEVOTHROID) 137 MCG tablet TAKE 1 TABLET BY MOUTH EVERY DAY 90 tablet 0  . minocycline (MINOCIN,DYNACIN) 100 MG capsule Take 100 mg  by mouth daily as needed. For rosacea     . OVER THE COUNTER MEDICATION Vitamin B 12 5000 mcg two tablets daily.    . tamoxifen (NOLVADEX) 20 MG tablet Take 1 tablet (20 mg total) by mouth daily. (Patient not taking: Reported on 02/24/2018) 90 tablet 12   No current facility-administered medications for this encounter.    REVIEW OF SYSTEMS: As above.     PHYSICAL EXAM:  vitals were not taken for this visit.   Not performed in light of telephone encounter.   LABORATORY DATA:  Lab Results  Component Value Date   WBC 11.1 (H) 06/19/2019   HGB 13.7 06/19/2019   HCT 41.0 06/19/2019   MCV 93.0 06/19/2019   PLT 273 06/19/2019   CMP     Component Value Date/Time   NA 141 06/19/2019 1524   NA 142 12/12/2012 0847   K 4.0 06/19/2019 1524   K 4.1 12/12/2012 0847   CL 107 06/19/2019 1524   CL 106 12/12/2012 0847   CO2 23 06/19/2019 1524   CO2 26 12/12/2012 0847   GLUCOSE 123 (H) 06/19/2019 1524   GLUCOSE 128 (H) 12/12/2012 0847   BUN 18 06/19/2019 1524   BUN 11.2 12/12/2012 0847   CREATININE 0.78 06/19/2019 1524   CREATININE 0.9 12/12/2012 0847   CALCIUM 8.9 06/19/2019 1524   CALCIUM 8.8 12/12/2012 0847   PROT 6.7 06/19/2019 1524   PROT 6.7 12/12/2012 0847   ALBUMIN 3.7 06/19/2019 1524   ALBUMIN 3.3 (L) 12/12/2012 0847   AST 14 (L) 06/19/2019 1524   AST 12 12/12/2012 0847   ALT 13 06/19/2019 1524   ALT 13 12/12/2012 0847   ALKPHOS 112 06/19/2019 1524   ALKPHOS 83 12/12/2012 0847   BILITOT 0.3 06/19/2019 1524   BILITOT 0.46 12/12/2012 0847   GFRNONAA >60 06/19/2019 1524   GFRAA >60 06/19/2019 1524         RADIOGRAPHY: US BREAST LTD UNI LEFT INC AXILLA  Addendum Date: 06/07/2019   ADDENDUM REPORT: 06/07/2019 11:58 ADDENDUM: Addendum for addition of findings. The left axilla was scanned. Normal appearing left axillary lymph node identified. No abnormal left axillary lymph nodes. Electronically Signed   By: Lovey Newcomer M.D.   On: 06/07/2019 11:58   Result Date: 06/07/2019 CLINICAL DATA:  Patient recalled from screening for left breast mass. EXAM: DIGITAL DIAGNOSTIC LEFT MAMMOGRAM WITH CAD AND TOMO ULTRASOUND LEFT BREAST COMPARISON:  Previous exam(s). ACR Breast Density Category b: There are scattered areas of fibroglandular density. FINDINGS: There is a persistent mildly irregular mass within the upper-outer left breast  anterior depth, further evaluated spot compression CC and MLO tomosynthesis images. Mammographic images were processed with CAD. On physical exam, no discrete mass is palpated within the upper-outer left breast. Targeted ultrasound is performed, showing a 4 x 4 x 3 mm irregular hypoechoic mass left breast 2 o'clock position 5 cm from nipple. IMPRESSION: Indeterminate left breast mass 2 o'clock position. RECOMMENDATION: Ultrasound-guided core needle biopsy left breast mass 2 o'clock position. I have discussed the findings and recommendations with the patient. If applicable, a reminder letter will be sent to the patient regarding the next appointment. BI-RADS CATEGORY  4: Suspicious. Electronically Signed: By: Lovey Newcomer M.D. On: 06/06/2019 09:22   MM DIAG BREAST TOMO UNI LEFT  Addendum Date: 06/07/2019   ADDENDUM REPORT: 06/07/2019 11:58 ADDENDUM: Addendum for addition of findings. The left axilla was scanned. Normal appearing left axillary lymph node identified. No abnormal left axillary lymph nodes. Electronically  Signed   By: Lovey Newcomer M.D.   On: 06/07/2019 11:58   Result Date: 06/07/2019 CLINICAL DATA:  Patient recalled from screening for left breast mass. EXAM: DIGITAL DIAGNOSTIC LEFT MAMMOGRAM WITH CAD AND TOMO ULTRASOUND LEFT BREAST COMPARISON:  Previous exam(s). ACR Breast Density Category b: There are scattered areas of fibroglandular density. FINDINGS: There is a persistent mildly irregular mass within the upper-outer left breast anterior depth, further evaluated spot compression CC and MLO tomosynthesis images. Mammographic images were processed with CAD. On physical exam, no discrete mass is palpated within the upper-outer left breast. Targeted ultrasound is performed, showing a 4 x 4 x 3 mm irregular hypoechoic mass left breast 2 o'clock position 5 cm from nipple. IMPRESSION: Indeterminate left breast mass 2 o'clock position. RECOMMENDATION: Ultrasound-guided core needle biopsy left breast mass  2 o'clock position. I have discussed the findings and recommendations with the patient. If applicable, a reminder letter will be sent to the patient regarding the next appointment. BI-RADS CATEGORY  4: Suspicious. Electronically Signed: By: Lovey Newcomer M.D. On: 06/06/2019 09:22   MM 3D SCREEN BREAST BILATERAL  Result Date: 05/28/2019 CLINICAL DATA:  Screening. EXAM: DIGITAL SCREENING BILATERAL MAMMOGRAM WITH TOMO AND CAD COMPARISON:  Previous exam(s). ACR Breast Density Category a: The breast tissue is almost entirely fatty. FINDINGS: In the left breast, a possible mass warrants further evaluation. In the right breast, no findings suspicious for malignancy. Images were processed with CAD. IMPRESSION: Further evaluation is suggested for possible mass in the left breast. RECOMMENDATION: Diagnostic mammogram and possibly ultrasound of the left breast. (Code:[id number]) The patient will be contacted regarding the findings, and additional imaging will be scheduled. BI-RADS CATEGORY  0: Incomplete. Need additional imaging evaluation and/or prior mammograms for comparison. Electronically Signed   By: Kristopher Oppenheim M.D.   On: 05/28/2019 14:29   MM CLIP PLACEMENT LEFT  Result Date: 06/06/2019 CLINICAL DATA:  Patient is post ultrasound-guided core needle biopsy of a 4 mm irregular mass over the 2 o'clock position of the left breast. EXAM: DIAGNOSTIC LEFT MAMMOGRAM POST ULTRASOUND BIOPSY COMPARISON:  Previous exam(s). FINDINGS: Mammographic images were obtained following ultrasound guided biopsy of the targeted mass at the 2 o'clock position. The biopsy marking clip is approximately 1 cm superomedial to the expected site of biopsy. IMPRESSION: Satisfactory positioning of the ribbon shaped biopsy marking clip as described at the site of biopsy in the upper-outer left breast. Final Assessment: Post Procedure Mammograms for Marker Placement Electronically Signed   By: Marin Olp M.D.   On: 06/06/2019 10:42   [place] LT  BREAST BX W LOC DEV 1ST LESION IMG BX SPEC US GUIDE  Addendum Date: 06/08/2019   ADDENDUM REPORT: 06/08/2019 07:47 ADDENDUM: Pathology revealed GRADE I INVASIVE DUCTAL CARCINOMA, DUCTAL CARCINOMA IN SITU of the LEFT breast, 2 o'clock, 5 cm from nipple. This was found to be concordant by Dr. Marin Olp. Pathology results were discussed with the patient by telephone. The patient reported doing well after the biopsy with tenderness at the site. Post biopsy instructions and care were reviewed and questions were answered. The patient was encouraged to call [facility] for any additional concerns. Per patient request, surgical consultation has been arranged with Dr. Coralie Keens at [facility] on June 15, 2019. Pathology results reported by Stacie Acres, RN on 06/08/2019. Electronically Signed   By: Marin Olp M.D.   On: 06/08/2019 07:47   Result Date: 06/08/2019 CLINICAL DATA:  Patient presents for ultrasound-guided  core needle biopsy of an indeterminate 4 mm left breast mass over the 2 o'clock position 5 cm from the nipple. EXAM: ULTRASOUND GUIDED LEFT BREAST CORE NEEDLE BIOPSY COMPARISON:  Previous exam(s). FINDINGS: I met with the patient and we discussed the procedure of ultrasound-guided biopsy, including benefits and alternatives. We discussed the high likelihood of a successful procedure. We discussed the risks of the procedure, including infection, bleeding, tissue injury, clip migration, and inadequate sampling. Informed written consent was given. The usual time-out protocol was performed immediately prior to the procedure. Lesion quadrant: Left upper outer quadrant. Using sterile technique and 1% Lidocaine as local anesthetic, under direct ultrasound visualization, a 14 gauge spring-loaded device was used to perform biopsy of the targeted mass at the 2 o'clock position using a inferior to superior approach. Three core tissue specimens were obtained. At the  conclusion of the procedure ribbon shaped tissue marker clip was deployed into the biopsy cavity. Follow up 2 view mammogram was performed and dictated separately. IMPRESSION: Ultrasound guided biopsy of an indeterminate 4 mm left breast mass. No apparent complications. Electronically Signed: By: Marin Olp M.D. On: 06/06/2019 10:28      IMPRESSION/PLAN: Left Breast Cancer   It was a pleasure meeting the patient today. We discussed the risks, benefits, and side effects of radiotherapy. I recommend radiotherapy to the left breast to reduce her risk of locoregional recurrence by 2/3.  We discussed that radiation would take approximately 4 weeks to complete and that I would give the patient a few weeks to heal following surgery before starting treatment planning. We spoke about acute effects including skin irritation and fatigue as well as much less common late effects including internal organ injury or irritation. We spoke about the latest technology that is used to minimize the risk of late effects for patients undergoing radiotherapy to the breast or chest wall. No guarantees of treatment were given. The patient is enthusiastic about proceeding with treatment. I look forward to participating in the patient's care.  I will await her referral back to me for postoperative follow-up and eventual CT simulation/treatment planning.  She knows to proceed with genetic counseling once scheduling and is interested in this. She plans to fulfill the antiestrogen therapy regimen for this cancer after radiotherapy.  This encounter was provided by telemedicine platform by telephone as patient was unable to access MyChart video during pandemic precautions. The patient has given verbal consent for this type of encounter and has been advised to only accept a meeting of this type in a secure network environment. The time spent during this encounter was 12 minutes. The attendants for this meeting include [name]  and  Lana Fish.  During the encounter, [name] was located at [facility] Radiation Oncology Department.  Lana Fish was located at home.     __________________________________________   [name], MD   This document serves as a record of services personally performed by [name], MD. It was created on her behalf by Wilburn Mylar, a trained medical scribe. The creation of this record is based on the scribe's personal observations and the provider's statements to them. This document has been checked and approved by the attending provider.

## 2019-06-20 ENCOUNTER — Encounter: Payer: Self-pay | Admitting: Radiation Oncology

## 2019-06-20 ENCOUNTER — Telehealth: Payer: Self-pay | Admitting: Oncology

## 2019-06-20 ENCOUNTER — Ambulatory Visit
Admission: RE | Admit: 2019-06-20 | Discharge: 2019-06-20 | Disposition: A | Discharge status: Home / Self Care | Payer: PRIVATE HEALTH INSURANCE | Source: Ambulatory Visit | Attending: Radiation Oncology | Admitting: Radiation Oncology

## 2019-06-20 ENCOUNTER — Other Ambulatory Visit: Payer: Self-pay

## 2019-06-20 DIAGNOSIS — C50412 Malignant neoplasm of upper-outer quadrant of left female breast: Secondary | ICD-10-CM

## 2019-06-20 DIAGNOSIS — Z170 Estrogen receptor positive status [ER+]: Secondary | ICD-10-CM

## 2019-06-20 NOTE — Telephone Encounter (Signed)
I left a message regarding schedule will mail 

## 2019-07-04 ENCOUNTER — Encounter (HOSPITAL_BASED_OUTPATIENT_CLINIC_OR_DEPARTMENT_OTHER): Payer: Self-pay | Admitting: Surgery

## 2019-07-04 ENCOUNTER — Other Ambulatory Visit: Payer: Self-pay

## 2019-07-09 ENCOUNTER — Other Ambulatory Visit (HOSPITAL_COMMUNITY)
Admission: RE | Admit: 2019-07-09 | Discharge: 2019-07-09 | Disposition: A | Discharge status: Home / Self Care | Payer: PRIVATE HEALTH INSURANCE | Source: Ambulatory Visit | Attending: Surgery | Admitting: Surgery

## 2019-07-09 DIAGNOSIS — Z20822 Contact with and (suspected) exposure to covid-19: Secondary | ICD-10-CM | POA: Diagnosis not present

## 2019-07-09 DIAGNOSIS — Z01812 Encounter for preprocedural laboratory examination: Secondary | ICD-10-CM | POA: Insufficient documentation

## 2019-07-09 LAB — SARS CORONAVIRUS 2 (TAT 6-24 HRS): SARS Coronavirus 2: NEGATIVE

## 2019-07-11 ENCOUNTER — Ambulatory Visit
Admission: RE | Admit: 2019-07-11 | Discharge: 2019-07-11 | Disposition: A | Discharge status: Home / Self Care | Payer: PRIVATE HEALTH INSURANCE | Source: Ambulatory Visit | Attending: Surgery | Admitting: Surgery

## 2019-07-11 ENCOUNTER — Other Ambulatory Visit: Payer: Self-pay

## 2019-07-11 DIAGNOSIS — C50912 Malignant neoplasm of unspecified site of left female breast: Secondary | ICD-10-CM

## 2019-07-11 NOTE — H&P (Signed)
PAYETON GERMANI Documented: 06/15/2019 9:34 AM Location: [facility] Patient #: [id number] DOB: [birth year] Single / Language: Cleophus Molt / Race: White Female   History of Present Illness (Jahira Swiss A. Ninfa Linden MD; 06/15/2019 9:52 AM) The patient is a 64 year old female who presents with breast cancer. This is a 64 year old female referred by Dr. Shon Baton after the recent diagnosis of left breast cancer. She was found to have a small area in the upper-outer quadrant of the left breast on screening mammography. She underwent a stereotactic biopsy showing both invasive and in situ ductal carcinoma. It is felt to be stage I. It is approximately 4 mm in size. It is 95% ER and PR positive, HER-2 negative, and the Ki-67 was 10%. She had a previous right breast lumpectomy in 2012 for a right breast cancer. She had postoperative and a hormonal therapy and radiation. Currently, she is otherwise doing well and has no complaints. She denies nipple discharge. Her family history is negative for breast cancer. It is positive for other malignancies.   Past Surgical History Andreas Blower, [place]; 06/15/2019 9:34 AM) Breast Biopsy  Bilateral. Breast Mass; Local Excision  Right. Colon Polyp Removal - Colonoscopy  Tonsillectomy   Diagnostic Studies History Andreas Blower, [place]; 06/15/2019 9:34 AM) Mammogram  1-3 years ago  Allergies Andreas Blower, CMA; 06/15/2019 9:35 AM) No Known Allergies [06/15/2019]:  Medication History (Armen Ferguson, CMA; 06/15/2019 9:38 AM) Levothyroxine Sodium (137MCG Tablet, Oral) Active. ALPRAZolam (0.5MG Tablet, Oral) Active. Celecoxib (200MG Capsule, Oral) Active. Zinc (22MG Tablet, Oral) Active. Vitamin D3 (125 MCG(5000 UT) Tablet, Oral) Active. Vitamin B-12 (2500MCG Tab Sublingual, Sublingual) Active. Medications Reconciled  Social History Andreas Blower, [place]; 06/15/2019 9:34 AM) Alcohol use  Occasional alcohol use. Caffeine use   Carbonated beverages, Tea. No drug use  Tobacco use  Current some day smoker.  Family History Andreas Blower, [place]; 06/15/2019 9:34 AM) Arthritis  Father. Kidney Disease  Mother.  Pregnancy / Birth History Andreas Blower, [place]; 06/15/2019 9:34 AM) Age at menarche  50 years. Age of menopause  73-60 Gravida  0 Irregular periods  Para  0  Other Problems (Armen Ferguson, CMA; 06/15/2019 9:34 AM) Breast Cancer  Lump In Breast  Thyroid Disease     Review of Systems (Armen Ferguson CMA; 06/15/2019 9:34 AM) General Not Present- Appetite Loss, Chills, Fatigue, Fever, Night Sweats, Weight Gain and Weight Loss. Skin Not Present- Change in Wart/Mole, Dryness, Hives, Jaundice, New Lesions, Non-Healing Wounds, Rash and Ulcer. HEENT Present- Wears glasses/contact lenses. Not Present- Earache, Hearing Loss, Hoarseness, Nose Bleed, Oral Ulcers, Ringing in the Ears, Seasonal Allergies, Sinus Pain, Sore Throat, Visual Disturbances and Yellow Eyes. Breast Present- Breast Mass. Not Present- Breast Pain, Nipple Discharge and Skin Changes. Cardiovascular Present- Shortness of Breath. Not Present- Chest Pain, Difficulty Breathing Lying Down, Leg Cramps, Palpitations, Rapid Heart Rate and Swelling of Extremities. Gastrointestinal Present- Indigestion. Not Present- Abdominal Pain, Bloating, Bloody Stool, Change in Bowel Habits, Chronic diarrhea, Constipation, Difficulty Swallowing, Excessive gas, Gets full quickly at meals, Hemorrhoids, Nausea, Rectal Pain and Vomiting. Female Genitourinary Not Present- Frequency, Nocturia, Painful Urination, Pelvic Pain and Urgency. Musculoskeletal Present- Joint Pain. Not Present- Back Pain, Joint Stiffness, Muscle Pain, Muscle Weakness and Swelling of Extremities. Neurological Not Present- Decreased Memory, Fainting, Headaches, Numbness, Seizures, Tingling, Tremor, Trouble walking and Weakness. Endocrine Not Present- Cold Intolerance, Excessive Hunger, Hair  Changes, Heat Intolerance, Hot flashes and New Diabetes. Hematology Present- Blood Thinners. Not Present- Easy Bruising, Excessive bleeding, Gland problems, HIV and Persistent Infections.  Vitals (Armen Ferguson CMA; 06/15/2019 9:35 AM) 06/15/2019 9:34 AM Weight: 219 lb Height: 64in Body Surface Area: 2.03 m Body Mass Index: 37.59 kg/m  Temp.: 97.54F  Pulse: 81 (Regular)  P.OX: 96% (Room air) BP: 142/90 (Sitting, Left Arm, Standard)       Physical Exam (Cecilee Rosner A. Ninfa Linden MD; 06/15/2019 9:53 AM) General Mental Status-Alert. General Appearance-Consistent with stated age. Hydration-Well hydrated. Voice-Normal.  Head and Neck Head-normocephalic, atraumatic with no lesions or palpable masses. Trachea-midline. Thyroid Gland Characteristics - normal size and consistency.  Eye Eyeball - Bilateral-Extraocular movements intact. Sclera/Conjunctiva - Bilateral-No scleral icterus.  Chest and Lung Exam Chest and lung exam reveals -quiet, even and easy respiratory effort with no use of accessory muscles and on auscultation, normal breath sounds, no adventitious sounds and normal vocal resonance. Inspection Chest Wall - Normal. Back - normal.  Breast Breast - Left-Symmetric, Non Tender, No Biopsy scars, no Dimpling - Left, No Inflammation, No Lumpectomy scars, No Mastectomy scars, No Peau d' Orange. Breast - Right-Symmetric, Non Tender, No Biopsy scars, no Dimpling - Right, No Inflammation, No Lumpectomy scars, No Mastectomy scars, No Peau d' Orange. Breast Lump-No Palpable Breast Mass. Note: There are no palpable masses in either breast and no axillary adenopathy on either side   Cardiovascular Cardiovascular examination reveals -normal heart sounds, regular rate and rhythm with no murmurs and normal pedal pulses bilaterally.  Abdomen - Did not examine.  Neurologic Neurologic evaluation reveals -alert and oriented x 3 with no impairment  of recent or remote memory. Mental Status-Normal.  Musculoskeletal - Did not examine.  Lymphatic Head & Neck  General Head & Neck Lymphatics: Bilateral - Description - Normal. Axillary  General Axillary Region: Bilateral - Description - Normal. Tenderness - Non Tender. Femoral & Inguinal - Did not examine.    Assessment & Plan (Audrielle Vankuren A. Ninfa Linden MD; 06/15/2019 9:54 AM) BREAST CANCER, STAGE 1, LEFT (C50.912) Impression: I have reviewed the patient's mammograms, ultrasound, and pathology results. I gave her a copy of the pathology results. She has a stage I left breast ductal carcinoma with in situ disease as well. It is small. We discussed breast cancer detail. Given her previous history, she wants to go and proceed with a lumpectomy. I discussed a left breast radioactive CT-guided lumpectomy/partial mastectomy with sentinel lymph node biopsy. I explained the surgical procedure in detail. We discussed the risks which includes but is not limited to bleeding, infection, injury to surrounding structures, the need for further surgery margins or nodes are positive, cardiopulmonary issues, postoperative recovery, etc. She is being referred to medical and radiation oncology as well as genetics although she does not appear to be interested in genetics for now. Surgery will be scheduled as soon as possible

## 2019-07-12 ENCOUNTER — Ambulatory Visit
Admission: RE | Admit: 2019-07-12 | Discharge: 2019-07-12 | Disposition: A | Discharge status: Home / Self Care | Payer: PRIVATE HEALTH INSURANCE | Source: Ambulatory Visit | Attending: Surgery | Admitting: Surgery

## 2019-07-12 ENCOUNTER — Encounter (HOSPITAL_BASED_OUTPATIENT_CLINIC_OR_DEPARTMENT_OTHER): Payer: Self-pay | Admitting: Surgery

## 2019-07-12 ENCOUNTER — Ambulatory Visit (HOSPITAL_BASED_OUTPATIENT_CLINIC_OR_DEPARTMENT_OTHER): Payer: PRIVATE HEALTH INSURANCE | Admitting: Anesthesiology

## 2019-07-12 ENCOUNTER — Other Ambulatory Visit: Payer: Self-pay

## 2019-07-12 ENCOUNTER — Encounter (HOSPITAL_BASED_OUTPATIENT_CLINIC_OR_DEPARTMENT_OTHER)
Admission: RE | Disposition: A | Discharge status: Home / Self Care | Payer: Self-pay | Source: Home / Self Care | Attending: Surgery

## 2019-07-12 ENCOUNTER — Encounter (HOSPITAL_COMMUNITY)
Admission: RE | Admit: 2019-07-12 | Discharge: 2019-07-12 | Disposition: A | Discharge status: Home / Self Care | Payer: PRIVATE HEALTH INSURANCE | Source: Ambulatory Visit | Attending: Surgery | Admitting: Surgery

## 2019-07-12 ENCOUNTER — Ambulatory Visit (HOSPITAL_BASED_OUTPATIENT_CLINIC_OR_DEPARTMENT_OTHER)
Admission: RE | Admit: 2019-07-12 | Discharge: 2019-07-12 | Disposition: A | Discharge status: Home / Self Care | Payer: PRIVATE HEALTH INSURANCE | Attending: Surgery | Admitting: Surgery

## 2019-07-12 DIAGNOSIS — E039 Hypothyroidism, unspecified: Secondary | ICD-10-CM | POA: Diagnosis not present

## 2019-07-12 DIAGNOSIS — C50912 Malignant neoplasm of unspecified site of left female breast: Secondary | ICD-10-CM

## 2019-07-12 DIAGNOSIS — D0512 Intraductal carcinoma in situ of left breast: Secondary | ICD-10-CM | POA: Insufficient documentation

## 2019-07-12 DIAGNOSIS — Z87891 Personal history of nicotine dependence: Secondary | ICD-10-CM | POA: Insufficient documentation

## 2019-07-12 DIAGNOSIS — C50412 Malignant neoplasm of upper-outer quadrant of left female breast: Secondary | ICD-10-CM | POA: Diagnosis present

## 2019-07-12 DIAGNOSIS — Z79890 Hormone replacement therapy (postmenopausal): Secondary | ICD-10-CM | POA: Diagnosis not present

## 2019-07-12 HISTORY — PX: BREAST LUMPECTOMY WITH RADIOACTIVE SEED AND SENTINEL LYMPH NODE BIOPSY: SHX6550

## 2019-07-12 SURGERY — BREAST LUMPECTOMY WITH RADIOACTIVE SEED AND SENTINEL LYMPH NODE BIOPSY
Anesthesia: Regional | Site: Breast | Laterality: Left

## 2019-07-12 MED ORDER — CELECOXIB 200 MG PO CAPS
200.0000 mg | ORAL_CAPSULE | ORAL | Status: AC
  Administered 2019-07-12: 12:00:00 200 mg via ORAL

## 2019-07-12 MED ORDER — PROPOFOL 10 MG/ML IV BOLUS
INTRAVENOUS | Status: AC
  Filled 2019-07-12: qty 20

## 2019-07-12 MED ORDER — OXYCODONE HCL 5 MG/5ML PO SOLN: 5.0000 mg | Freq: Once | ORAL | Status: DC | PRN

## 2019-07-12 MED ORDER — MIDAZOLAM HCL 2 MG/2ML IJ SOLN
1.0000 mg | INTRAMUSCULAR | Status: DC | PRN
  Administered 2019-07-12: 2 mg via INTRAVENOUS

## 2019-07-12 MED ORDER — BUPIVACAINE HCL (PF) 0.5 % IJ SOLN
INTRAMUSCULAR | Status: DC | PRN
  Administered 2019-07-12: 13 mL

## 2019-07-12 MED ORDER — LACTATED RINGERS IV SOLN: INTRAVENOUS | Status: DC

## 2019-07-12 MED ORDER — ACETAMINOPHEN 500 MG PO TABS
ORAL_TABLET | ORAL | Status: AC
  Filled 2019-07-12: qty 2

## 2019-07-12 MED ORDER — PROPOFOL 10 MG/ML IV BOLUS
INTRAVENOUS | Status: DC | PRN
  Administered 2019-07-12: 150 mg via INTRAVENOUS

## 2019-07-12 MED ORDER — PHENYLEPHRINE HCL (PRESSORS) 10 MG/ML IV SOLN
INTRAVENOUS | Status: DC | PRN
  Administered 2019-07-12 (×4): 100 ug via INTRAVENOUS

## 2019-07-12 MED ORDER — FENTANYL CITRATE (PF) 100 MCG/2ML IJ SOLN
INTRAMUSCULAR | Status: AC
  Filled 2019-07-12: qty 2

## 2019-07-12 MED ORDER — PHENYLEPHRINE 40 MCG/ML (10ML) SYRINGE FOR IV PUSH (FOR BLOOD PRESSURE SUPPORT)
PREFILLED_SYRINGE | INTRAVENOUS | Status: AC
  Filled 2019-07-12: qty 20

## 2019-07-12 MED ORDER — ACETAMINOPHEN 500 MG PO TABS
1000.0000 mg | ORAL_TABLET | ORAL | Status: AC
  Administered 2019-07-12: 12:00:00 1000 mg via ORAL

## 2019-07-12 MED ORDER — CHLORHEXIDINE GLUCONATE CLOTH 2 % EX PADS: 6.0000 | MEDICATED_PAD | Freq: Once | CUTANEOUS | Status: DC

## 2019-07-12 MED ORDER — MIDAZOLAM HCL 2 MG/2ML IJ SOLN
INTRAMUSCULAR | Status: AC
  Filled 2019-07-12: qty 2

## 2019-07-12 MED ORDER — SCOPOLAMINE 1 MG/3DAYS TD PT72
1.0000 | MEDICATED_PATCH | TRANSDERMAL | Status: DC
  Administered 2019-07-12: 1.5 mg via TRANSDERMAL

## 2019-07-12 MED ORDER — GABAPENTIN 300 MG PO CAPS
ORAL_CAPSULE | ORAL | Status: AC
  Filled 2019-07-12: qty 1

## 2019-07-12 MED ORDER — CELECOXIB 200 MG PO CAPS
ORAL_CAPSULE | ORAL | Status: AC
  Filled 2019-07-12: qty 1

## 2019-07-12 MED ORDER — MEPERIDINE HCL 25 MG/ML IJ SOLN: 6.2500 mg | INTRAMUSCULAR | Status: DC | PRN

## 2019-07-12 MED ORDER — EPHEDRINE SULFATE 50 MG/ML IJ SOLN
INTRAMUSCULAR | Status: DC | PRN
  Administered 2019-07-12 (×2): 10 mg via INTRAVENOUS

## 2019-07-12 MED ORDER — EPHEDRINE 5 MG/ML INJ
INTRAVENOUS | Status: AC
  Filled 2019-07-12: qty 10

## 2019-07-12 MED ORDER — ONDANSETRON HCL 4 MG/2ML IJ SOLN
INTRAMUSCULAR | Status: DC | PRN
  Administered 2019-07-12: 4 mg via INTRAVENOUS

## 2019-07-12 MED ORDER — OXYCODONE HCL 5 MG PO TABS: 5.0000 mg | ORAL_TABLET | Freq: Once | ORAL | Status: DC | PRN

## 2019-07-12 MED ORDER — LIDOCAINE 2% (20 MG/ML) 5 ML SYRINGE
INTRAMUSCULAR | Status: AC
  Filled 2019-07-12: qty 5

## 2019-07-12 MED ORDER — ONDANSETRON HCL 4 MG/2ML IJ SOLN
INTRAMUSCULAR | Status: AC
  Filled 2019-07-12: qty 2

## 2019-07-12 MED ORDER — CEFAZOLIN SODIUM-DEXTROSE 2-4 GM/100ML-% IV SOLN
INTRAVENOUS | Status: AC
  Filled 2019-07-12: qty 100

## 2019-07-12 MED ORDER — HYDROMORPHONE HCL 1 MG/ML IJ SOLN: 0.2500 mg | INTRAMUSCULAR | Status: DC | PRN

## 2019-07-12 MED ORDER — KETOROLAC TROMETHAMINE 30 MG/ML IJ SOLN: 30.0000 mg | Freq: Once | INTRAMUSCULAR | Status: DC | PRN

## 2019-07-12 MED ORDER — BUPIVACAINE LIPOSOME 1.3 % IJ SUSP
INTRAMUSCULAR | Status: DC | PRN
  Administered 2019-07-12: 10 mL

## 2019-07-12 MED ORDER — CEFAZOLIN SODIUM-DEXTROSE 2-4 GM/100ML-% IV SOLN: 2.0000 g | INTRAVENOUS | Status: DC

## 2019-07-12 MED ORDER — SCOPOLAMINE 1 MG/3DAYS TD PT72
MEDICATED_PATCH | TRANSDERMAL | Status: AC
  Filled 2019-07-12: qty 1

## 2019-07-12 MED ORDER — FENTANYL CITRATE (PF) 100 MCG/2ML IJ SOLN
50.0000 ug | INTRAMUSCULAR | Status: AC | PRN
  Administered 2019-07-12: 13:00:00 50 ug via INTRAVENOUS
  Administered 2019-07-12: 12:00:00 100 ug via INTRAVENOUS
  Administered 2019-07-12: 12:00:00 50 ug via INTRAVENOUS

## 2019-07-12 MED ORDER — OXYCODONE HCL 5 MG PO TABS: 5.0000 mg | ORAL_TABLET | Freq: Four times a day (QID) | ORAL | 0 refills | Status: DC | PRN

## 2019-07-12 MED ORDER — LIDOCAINE 2% (20 MG/ML) 5 ML SYRINGE
INTRAMUSCULAR | Status: DC | PRN
  Administered 2019-07-12: 40 mg via INTRAVENOUS

## 2019-07-12 MED ORDER — GABAPENTIN 300 MG PO CAPS
300.0000 mg | ORAL_CAPSULE | ORAL | Status: AC
  Administered 2019-07-12: 12:00:00 300 mg via ORAL

## 2019-07-12 MED ORDER — PROMETHAZINE HCL 25 MG/ML IJ SOLN: 6.2500 mg | INTRAMUSCULAR | Status: DC | PRN

## 2019-07-12 MED ORDER — ROPIVACAINE HCL 5 MG/ML IJ SOLN
INTRAMUSCULAR | Status: DC | PRN
  Administered 2019-07-12: 20 mL via PERINEURAL

## 2019-07-12 MED ORDER — TECHNETIUM TC 99M SULFUR COLLOID FILTERED
1.0000 | Freq: Once | INTRAVENOUS | Status: AC | PRN
  Administered 2019-07-12: 1 via INTRADERMAL

## 2019-07-12 MED ORDER — DEXAMETHASONE SODIUM PHOSPHATE 10 MG/ML IJ SOLN
INTRAMUSCULAR | Status: AC
  Filled 2019-07-12: qty 1

## 2019-07-12 SURGICAL SUPPLY — 44 items
APPLIER CLIP 9.375 MED OPEN (MISCELLANEOUS) ×2
BINDER BREAST XXLRG (GAUZE/BANDAGES/DRESSINGS) ×2 IMPLANT
BLADE HEX COATED 2.75 (ELECTRODE) ×2 IMPLANT
BLADE SURG 15 STRL LF DISP TIS (BLADE) ×1 IMPLANT
BLADE SURG 15 STRL SS (BLADE) ×1
CANISTER SUCT 1200ML W/VALVE (MISCELLANEOUS) IMPLANT
CHLORAPREP W/TINT 26 (MISCELLANEOUS) ×2 IMPLANT
CLIP APPLIE 9.375 MED OPEN (MISCELLANEOUS) ×1 IMPLANT
CLIP VESOCCLUDE SM WIDE 6/CT (CLIP) ×2 IMPLANT
COVER BACK TABLE 60X90IN (DRAPES) ×2 IMPLANT
COVER MAYO STAND STRL (DRAPES) ×2 IMPLANT
COVER PROBE W GEL 5X96 (DRAPES) ×2 IMPLANT
DECANTER SPIKE VIAL GLASS SM (MISCELLANEOUS) IMPLANT
DERMABOND ADVANCED (GAUZE/BANDAGES/DRESSINGS) ×1
DERMABOND ADVANCED .7 DNX12 (GAUZE/BANDAGES/DRESSINGS) ×1 IMPLANT
DRAPE LAPAROSCOPIC ABDOMINAL (DRAPES) ×2 IMPLANT
DRAPE UTILITY XL STRL (DRAPES) ×2 IMPLANT
ELECT REM PT RETURN 9FT ADLT (ELECTROSURGICAL) ×2
ELECTRODE REM PT RTRN 9FT ADLT (ELECTROSURGICAL) ×1 IMPLANT
GAUZE SPONGE 4X4 12PLY STRL LF (GAUZE/BANDAGES/DRESSINGS) IMPLANT
GLOVE BIO SURGEON STRL SZ 6.5 (GLOVE) ×2 IMPLANT
GLOVE SURG SIGNA 7.5 PF LTX (GLOVE) ×2 IMPLANT
GOWN STRL REUS W/ TWL LRG LVL3 (GOWN DISPOSABLE) ×1 IMPLANT
GOWN STRL REUS W/ TWL XL LVL3 (GOWN DISPOSABLE) ×1 IMPLANT
GOWN STRL REUS W/TWL LRG LVL3 (GOWN DISPOSABLE) ×1
GOWN STRL REUS W/TWL XL LVL3 (GOWN DISPOSABLE) ×1
KIT MARKER MARGIN INK (KITS) ×2 IMPLANT
NDL SAFETY ECLIPSE 18X1.5 (NEEDLE) ×1 IMPLANT
NEEDLE HYPO 18GX1.5 SHARP (NEEDLE) ×1
NEEDLE HYPO 25X1 1.5 SAFETY (NEEDLE) ×2 IMPLANT
NS IRRIG 1000ML POUR BTL (IV SOLUTION) ×2 IMPLANT
PACK BASIN DAY SURGERY FS (CUSTOM PROCEDURE TRAY) ×2 IMPLANT
PENCIL SMOKE EVACUATOR (MISCELLANEOUS) ×2 IMPLANT
SLEEVE SCD COMPRESS KNEE MED (MISCELLANEOUS) ×2 IMPLANT
SPONGE LAP 4X18 RFD (DISPOSABLE) ×2 IMPLANT
SUT MNCRL AB 4-0 PS2 18 (SUTURE) ×2 IMPLANT
SUT SILK 2 0 SH (SUTURE) IMPLANT
SUT VIC AB 3-0 SH 27 (SUTURE) ×1
SUT VIC AB 3-0 SH 27X BRD (SUTURE) ×1 IMPLANT
SYR CONTROL 10ML LL (SYRINGE) ×2 IMPLANT
TOWEL GREEN STERILE FF (TOWEL DISPOSABLE) ×2 IMPLANT
TRAY FAXITRON CT DISP (TRAY / TRAY PROCEDURE) ×2 IMPLANT
TUBE CONNECTING 20X1/4 (TUBING) IMPLANT
YANKAUER SUCT BULB TIP NO VENT (SUCTIONS) IMPLANT

## 2019-07-12 NOTE — Anesthesia Procedure Notes (Signed)
Procedure Name: LMA Insertion Date/Time: 07/12/2019 12:27 PM Performed by: Maryella Shivers, CRNA Pre-anesthesia Checklist: Patient identified, Emergency Drugs available, Suction available, Patient being monitored and Timeout performed Patient Re-evaluated:Patient Re-evaluated prior to induction Oxygen Delivery Method: Circle system utilized Preoxygenation: Pre-oxygenation with 100% oxygen Induction Type: IV induction LMA: LMA inserted LMA Size: 4.0 Number of attempts: 1 Tube secured with: Tape Dental Injury: Teeth and Oropharynx as per pre-operative assessment

## 2019-07-12 NOTE — Transfer of Care (Signed)
Immediate Anesthesia Transfer of Care Note  Patient: Kristen Gibson  Procedure(s) Performed: LEFT BREAST LUMPECTOMY WITH RADIOACTIVE SEED AND SENTINEL LYMPH NODE BIOPSY (Left Breast)  Patient Location: PACU  Anesthesia Type:General  Level of Consciousness: awake, alert , oriented and patient cooperative  Airway & Oxygen Therapy: Patient Spontanous Breathing and Patient connected to face mask oxygen  Post-op Assessment: Report given to RN and Post -op Vital signs reviewed and stable  Post vital signs: Reviewed and stable  Last Vitals:  Vitals Value Taken Time  BP    Temp    Pulse 70 07/12/19 1329  Resp 13 07/12/19 1329  SpO2 96 % 07/12/19 1329    Last Pain:  Vitals:   07/12/19 1132  TempSrc: Temporal  PainSc: 0-No pain         Complications: No apparent anesthesia complications

## 2019-07-12 NOTE — Progress Notes (Signed)
Assisted Dr. Doroteo Glassman with left, ultrasound guided, pectoralis block. Side rails up, monitors on throughout procedure. See vital signs in flow sheet. Tolerated Procedure well.

## 2019-07-12 NOTE — Anesthesia Procedure Notes (Signed)
Anesthesia Regional Block: Pectoralis block   Pre-Anesthetic Checklist: ,, timeout performed, Correct Patient, Correct Site, Correct Laterality, Correct Procedure, Correct Position, site marked, Risks and benefits discussed,  Surgical consent,  Pre-op evaluation,  At surgeon's request and post-op pain management  Laterality: Left  Prep: Maximum Sterile Barrier Precautions used, chloraprep       Needles:  Injection technique: Single-shot  Needle Type: Echogenic Stimulator Needle     Needle Length: 9cm  Needle Gauge: 22     Additional Needles:   Procedures:,,,, ultrasound used (permanent image in chart),,,,  Narrative:  Start time: 07/12/2019 11:45 AM End time: 07/12/2019 11:50 AM Injection made incrementally with aspirations every 5 mL.  Performed by: Personally  Anesthesiologist: Pervis Hocking, DO  Additional Notes: Monitors applied. No increased pain on injection. No increased resistance to injection. Injection made in 5cc increments. Good needle visualization. Patient tolerated procedure well.

## 2019-07-12 NOTE — Progress Notes (Signed)
Nuc med injection done by nuc med staff.  VS stable. No additional medication given.  Emotional support provided. Patient tolerated well.

## 2019-07-12 NOTE — Op Note (Signed)
LEFT BREAST PARTIAL MASTECTOMY WITH RADIOACTIVE SEED AND DEEP LEFT AXILLARY SENTINEL LYMPH NODE BIOPSY  Procedure Note  OLEA ARAGON 07/12/2019   Pre-op Diagnosis: LEFT BREAST CANCER     Post-op Diagnosis: same  Procedure(s): LEFT BREAST PARTIAL MASTECTOMYWITH RADIOACTIVE SEED AND DEEP LEFT AXILLARY SENTINEL LYMPH NODE BIOPSY  Surgeon(s): Coralie Keens, MD  Anesthesia: General  Staff:  Circulator: Izora Ribas, RN Scrub Person: Lorenza Burton, CST  Estimated Blood Loss: Minimal               Specimens: sent to path  Indications: This is a 64 year old female with a left breast stage I breast cancer.  The decision was made to proceed with a radioactive seed guided left breast partial mastectomy and sentinel lymph node biopsy  Procedure: The patient was brought to the operating room identifies the correct patient.  She is placed upon the operating table general anesthesia was induced.  Her left breast and axilla were prepped and draped in usual sterile fashion.  Using the neoprobe, identified the radioactive seed in the upper outer quadrant of the left breast at the 2 o'clock position.  I anesthetized the skin at the edge of the areola with Marcaine.  I made an incision with a scalpel.  I then dissected up toward the upper outer quadrant of the left breast with the neoprobe.  Identified the radioactive seed and stayed widely around it with the aid of the neoprobe and the cautery.  I completed the partial mastectomy and excised the specimen.  I then marked the margins with marker paint.  An x-ray was performed confirming that the previous biopsy marker and radioactive seed were in the specimen.  It was then sent to pathology for evaluation.  I achieved hemostasis with the cautery and the breast cavity.  I then turned my attention toward the sentinel lymph nodes.  Identified an area of increased uptake in the left axilla.  I then made an incision with a scalpel.  I dissected down to  the deep left axillary tissue.  She had a large amount of fatty tissue in the deep axilla which I excised with the cautery before I could identify the sentinel lymph node.  All this tissue was sent to pathology for evaluation along with lymph node.  Again, hemostasis was achieved with the cautery.  I then closed both incisions with interrupted 3-0 Vicryl sutures in the subcutaneous tissue and then closed the skin with 4-0 Monocryl running sutures.  Dermabond was then applied.  The patient tolerated the procedure well.  All the counts were correct at the end of the procedure.  The patient was then extubated in the operating room and taken in a stable condition to the recovery room.          Coralie Keens   Date: 07/12/2019  Time: 1:21 PM

## 2019-07-12 NOTE — Anesthesia Preprocedure Evaluation (Signed)
Anesthesia Evaluation  Patient identified by MRN, date of birth, ID band Patient awake    Reviewed: Allergy & Precautions, H&P , NPO status , Patient's Chart, lab work & pertinent test results  Airway Mallampati: II  TM Distance: >3 FB Neck ROM: full    Dental  (+) Teeth Intact, Dental Advidsory Given   Pulmonary former smoker,  Quit smoking 2010, 9 pack year history   Pulmonary exam normal breath sounds clear to auscultation       Cardiovascular negative cardio ROS   Rhythm:regular Rate:Normal     Neuro/Psych PSYCHIATRIC DISORDERS Anxiety negative neurological ROS     GI/Hepatic Neg liver ROS, GERD  Controlled,  Endo/Other  Hypothyroidism   Renal/GU negative Renal ROS  negative genitourinary   Musculoskeletal  (+) Arthritis , Osteoarthritis,    Abdominal (+) + obese,   Peds  Hematology negative hematology ROS (+)   Anesthesia Other Findings L breast ca; hx R breast ca s/p radiation to right breast in 2012  Reproductive/Obstetrics negative OB ROS                             Anesthesia Physical  Anesthesia Plan  ASA: II  Anesthesia Plan: General and Regional   Post-op Pain Management:    Induction: Intravenous  PONV Risk Score and Plan: 2 and Ondansetron, Dexamethasone, Scopolamine patch - Pre-op, Midazolam and Treatment may vary due to age or medical condition  Airway Management Planned: LMA  Additional Equipment: None  Intra-op Plan:   Post-operative Plan: Extubation in OR  Informed Consent: I have reviewed the patients History and Physical, chart, labs and discussed the procedure including the risks, benefits and alternatives for the proposed anesthesia with the patient or authorized representative who has indicated his/her understanding and acceptance.     Dental advisory given  Plan Discussed with: CRNA  Anesthesia Plan Comments:         Anesthesia Quick  Evaluation

## 2019-07-12 NOTE — Interval H&P Note (Signed)
History and Physical Interval Note: no change in H and P  07/12/2019 11:33 AM  [name]  has presented today for surgery, with the diagnosis of LEFT BREAST CANCER.  The various methods of treatment have been discussed with the patient and family. After consideration of risks, benefits and other options for treatment, the patient has consented to  Procedure(s): LEFT BREAST LUMPECTOMY WITH RADIOACTIVE SEED AND SENTINEL LYMPH NODE BIOPSY (Left) as a surgical intervention.  The patient's history has been reviewed, patient examined, no change in status, stable for surgery.  I have reviewed the patient's chart and labs.  Questions were answered to the patient's satisfaction.     Coralie Keens

## 2019-07-12 NOTE — Discharge Instructions (Signed)
Next Dose of Ibuprofen or Tylenol at 5:30 PM   [employer] Phone Number [phone]  BREAST BIOPSY/ PARTIAL MASTECTOMY: POST OP INSTRUCTIONS  Always review your discharge instruction sheet given to you by the facility where your surgery was performed.  IF YOU HAVE DISABILITY OR FAMILY LEAVE FORMS, YOU MUST BRING THEM TO THE OFFICE FOR PROCESSING.  DO NOT GIVE THEM TO YOUR DOCTOR.  1. A prescription for pain medication may be given to you upon discharge.  Take your pain medication as prescribed, if needed.  If narcotic pain medicine is not needed, then you may take acetaminophen (Tylenol) or ibuprofen (Advil) as needed. 2. Take your usually prescribed medications unless otherwise directed 3. If you need a refill on your pain medication, please contact your pharmacy.  They will contact our office to request authorization.  Prescriptions will not be filled after 5pm or on week-ends. 4. You should eat very light the first 24 hours after surgery, such as soup, crackers, pudding, etc.  Resume your normal diet the day after surgery. 5. Most patients will experience some swelling and bruising in the breast.  Ice packs and a good support bra will help.  Swelling and bruising can take several days to resolve.  6. It is common to experience some constipation if taking pain medication after surgery.  Increasing fluid intake and taking a stool softener will usually help or prevent this problem from occurring.  A mild laxative (Milk of Magnesia or Miralax) should be taken according to package directions if there are no bowel movements after 48 hours. 7. Unless discharge instructions indicate otherwise, you may remove your bandages 24-48 hours after surgery, and you may shower at that time.  You may have steri-strips (small skin tapes) in place directly over the incision.  These strips should be left on the skin for 7-10 days.  If your surgeon used skin glue on the incision, you may shower  in 24 hours.  The glue will flake off over the next 2-3 weeks.  Any sutures or staples will be removed at the office during your follow-up visit. 8. ACTIVITIES:  You may resume regular daily activities (gradually increasing) beginning the next day.  Wearing a good support bra or sports bra minimizes pain and swelling.  You may have sexual intercourse when it is comfortable. a. You may drive when you no longer are taking prescription pain medication, you can comfortably wear a seatbelt, and you can safely maneuver your car and apply brakes. b. RETURN TO WORK:  ______________________________________________________________________________________ 9. You should see your doctor in the office for a follow-up appointment approximately two weeks after your surgery.  Your doctor's nurse will typically make your follow-up appointment when she calls you with your pathology report.  Expect your pathology report 2-3 business days after your surgery.  You may call to check if you do not hear from [place] after three days. 10. OTHER INSTRUCTIONS:OK TO SHOWER STARTING TOMORROW 11. ICE PACK, TYLENOL, AND IBUPROFEN ALSO FOR PAIN 12. NO VIGOROUS ACTIVITY FOR ONE WEEK _______________________________________________________________________________________________ _____________________________________________________________________________________________________________________________________ _____________________________________________________________________________________________________________________________________ _____________________________________________________________________________________________________________________________________  WHEN TO CALL YOUR DOCTOR: 1. Fever over 101.0 2. Nausea and/or vomiting. 3. Extreme swelling or bruising. 4. Continued bleeding from incision. 5. Increased pain, redness, or drainage from the incision.  The clinic staff is available to answer your questions during  regular business hours.  Please don't hesitate to call and ask to speak to one of the nurses for clinical concerns.  If you have a medical emergency,  go to the nearest emergency room or call 911.  A surgeon from [facility] is always on call at the hospital.  For further questions, please visit [email or web address]      Post Anesthesia Home Care Instructions  Activity: Get plenty of rest for the remainder of the day. A responsible individual must stay with you for 24 hours following the procedure.  For the next 24 hours, DO NOT: -Drive a car -Paediatric nurse -Drink alcoholic beverages -Take any medication unless instructed by your physician -Make any legal decisions or sign important papers.  Meals: Start with liquid foods such as gelatin or soup. Progress to regular foods as tolerated. Avoid greasy, spicy, heavy foods. If nausea and/or vomiting occur, drink only clear liquids until the nausea and/or vomiting subsides. Call your physician if vomiting continues.  Special Instructions/Symptoms: Your throat may feel dry or sore from the anesthesia or the breathing tube placed in your throat during surgery. If this causes discomfort, gargle with warm salt water. The discomfort should disappear within 24 hours.  If you had a scopolamine patch placed behind your ear for the management of post- operative nausea and/or vomiting:  1. The medication in the patch is effective for 72 hours, after which it should be removed.  Wrap patch in a tissue and discard in the trash. Wash hands thoroughly with soap and water. 2. You may remove the patch earlier than 72 hours if you experience unpleasant side effects which may include dry mouth, dizziness or visual disturbances. 3. Avoid touching the patch. Wash your hands with soap and water after contact with the patch.

## 2019-07-13 ENCOUNTER — Encounter: Payer: Self-pay | Admitting: *Deleted

## 2019-07-13 NOTE — Addendum Note (Signed)
Addendum  created 07/13/19 0840 by Tawni Millers, CRNA   Charge Capt[employer] section accepted

## 2019-07-13 NOTE — Anesthesia Postprocedure Evaluation (Signed)
Anesthesia Post Note  Patient: Kristen Gibson  Procedure(s) Performed: LEFT BREAST LUMPECTOMY WITH RADIOACTIVE SEED AND SENTINEL LYMPH NODE BIOPSY (Left Breast)     Patient location during evaluation: PACU Anesthesia Type: Regional and General Level of consciousness: awake and alert, oriented and patient cooperative Pain management: pain level controlled Vital Signs Assessment: post-procedure vital signs reviewed and stable Respiratory status: spontaneous breathing, nonlabored ventilation and respiratory function stable Cardiovascular status: blood pressure returned to baseline and stable Postop Assessment: no apparent nausea or vomiting Anesthetic complications: no    Last Vitals:  Vitals:   07/12/19 1400 07/12/19 1425  BP: 103/73 111/71  Pulse: 71 69  Resp: 18 18  Temp:  36.9 C  SpO2: 94% 95%    Last Pain:  Vitals:   07/12/19 1425  TempSrc:   PainSc: 0-No pain                 Jarome Matin Miriam Liles

## 2019-07-16 ENCOUNTER — Other Ambulatory Visit: Payer: Self-pay | Admitting: Oncology

## 2019-07-16 ENCOUNTER — Encounter: Payer: Self-pay | Admitting: *Deleted

## 2019-07-16 LAB — SURGICAL PATHOLOGY

## 2019-07-18 ENCOUNTER — Encounter: Payer: Self-pay | Admitting: *Deleted

## 2019-07-27 NOTE — Progress Notes (Signed)
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  Receptor Status: ER(95%), PR (95%), Her2-neu (NEG), Ki-(10%)   07/12/2019 FINAL MICROSCOPIC DIAGNOSIS: A. BREAST, LEFT, LUMPECTOMY: - Invasive ductal carcinoma. - Ductal carcinoma in situ. - Margins not involved. - Biopsy site and biopsy clip. - See oncology table. B. LYMPH NODE, LEFT AXILLARY, SENTINEL, BIOPSY: - One lymph node with no metastatic carcinoma (0/1). C. LYMPH NODE, LEFT AXILLARY, SENTINEL, BIOPSY: - Benign connective tissue. - No lymph node tissue or carcinoma.  Did patient present with symptoms or was this found on screening mammography?: It was found on a screening mammogram.   Past/Anticipated interventions by surgeon, if any: 07/12/2019 Procedure(s): LEFT BREAST PARTIAL MASTECTOMYWITH RADIOACTIVE SEED AND DEEP LEFT AXILLARY SENTINEL LYMPH NODE BIOPSY Surgeon(s): Coralie Keens, MD    Past/Anticipated interventions by medical oncology, if any:  06/19/19 Dr. Jana Hakim consult.  The overall plan then is for surgery followed by radiation then consideration of antiestrogens  **next appointment scheduled for 09/11/19  Lymphedema issues, if any:   She had minor swelling/fluid to her left lateral breast.   Pain issues, if any:  She has soreness to her incision.  SAFETY ISSUES:  Prior radiation? Yes, 06/17/2011 - 08/04/2011 Right Breast - 45 Gy/25 fractions whole breast, and 16 Gy/8 fractions for boost  Pacemaker/ICD? No  Possible current pregnancy? No  Is the patient on methotrexate? No  Current Complaints / other details:    BP (!) 142/89 (BP Location: Right Arm)   Pulse 63   Temp 98.7 F (37.1 C) (Tympanic)   Resp 18   Wt 220 lb (99.8 kg)   LMP 10/08/2010   SpO2 99%   BMI 37.76 kg/m    Wt Readings from Last 3 Encounters:   08/03/19 220 lb (99.8 kg)  07/12/19 218 lb 11.1 oz (99.2 kg)  06/19/19 220 lb (99.8 kg)

## 2019-07-31 ENCOUNTER — Other Ambulatory Visit: Payer: Self-pay | Admitting: Internal Medicine

## 2019-07-31 DIAGNOSIS — R911 Solitary pulmonary nodule: Secondary | ICD-10-CM

## 2019-08-02 NOTE — Progress Notes (Signed)
Radiation Oncology         [phone] ________________________________  Name: Kristen Gibson MRN: [id number]  Date: 08/03/2019  DOB: 05/30/1956  Follow-Up Visit Note - In Person  Outpatient  CC: Russo, John, MD  Magrinat, Gustav C, MD  Diagnosis:      ICD-10-CM   1. Malignant neoplasm of upper-outer quadrant of left breast in female, estrogen receptor positive (HCC)  C50.412    Z17.0      Cancer Staging Malignant neoplasm of upper-outer quadrant of left breast in female, estrogen receptor positive (HCC) Staging form: Breast, AJCC 8th Edition - Clinical stage from 06/06/2019: Stage IA (cT1a, cN0, cM0, G1, ER+, PR+, HER2-) - Signed by Causey, Lindsey Cornetto, NP on 06/13/2019 - Pathologic stage from 07/12/2019: Stage IA (pT1b, pN0, cM0, G1, ER+, PR+, HER2-) - Signed by Causey, Lindsey Cornetto, NP on 07/25/2019   CHIEF COMPLAINT: Here to discuss management of left breast cancer  Narrative:  The patient returns today for follow-up to discuss radiation treatment options. She was seen in consultation on 06/20/2019.     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  Symptomatically, the patient reports: doing well.  Scars have healed nicely.  She is still working.  She notes a symmetry between her breasts, her left breast is larger than her right ever since completing treatment for right breast cancer (including radiotherapy ) several years ago.         ALLERGIES:  has No Known Allergies.  Meds: Current Outpatient Medications  Medication Sig Dispense Refill  . ALPRAZolam (XANAX) 0.5 MG tablet Take 0.25 mg by mouth at bedtime as needed.    . celecoxib (CELEBREX) 200 MG  capsule Take 200 mg by mouth 2 (two) times daily.    . Cholecalciferol (VITAMIN D) 1000 UNITS capsule Take 1,000 Units by mouth daily.      . FINACEA 15 % cream     . levothyroxine (SYNTHROID, LEVOTHROID) 137 MCG tablet TAKE 1 TABLET BY MOUTH EVERY DAY 90 tablet 0  . minocycline (MINOCIN,DYNACIN) 100 MG capsule Take 100 mg by mouth daily as needed. For rosacea     . omeprazole (PRILOSEC) 20 MG capsule Take 20 mg by mouth daily.    . OVER THE COUNTER MEDICATION Vitamin B 12 5000 mcg two tablets daily.    . oxyCODONE (OXY IR/ROXICODONE) 5 MG immediate release tablet Take 1 tablet (5 mg total) by mouth every 6 (six) hours as needed for moderate pain or severe pain. (Patient not taking: Reported on 08/03/2019) 25 tablet 0  . tamoxifen (NOLVADEX) 20 MG tablet Take 1 tablet (20 mg total) by mouth daily. (Patient not taking: Reported on 02/24/2018) 90 tablet 12   No current facility-administered medications for this encounter.    Physical Findings:  weight is 220 lb (99.8 kg). Her tympanic temperature is 98.7 F (37.1 C). Her blood pressure is 142/89 (abnormal) and her pulse is 63. Her respiration is 18 and oxygen saturation is 99%. .     General: Alert and oriented, in no acute distress Psychiatric: Judgment and insight are intact. Affect is appropriate. Breast exam reveals excellent healing of lumpectomy and axillary scar, left breast    Lab Findings: Lab Results  Component Value Date   WBC 11.1 (H) 06/19/2019   HGB 13.7 06/19/2019   HCT 41.0 06/19/2019   MCV 93.0 06/19/2019   PLT 273 06/19/2019    Radiographic Findings: NM Sentinel Node Inj-No Rpt (Breast)  Result Date: 07/12/2019 Sulfur colloid was injected by the nuclear medicine technologist for melanoma sentinel node.   MM Breast Surgical Specimen  Result Date: 07/12/2019 CLINICAL DATA:  Patient status post left breast lumpectomy. EXAM: SPECIMEN RADIOGRAPH OF THE LEFT BREAST COMPARISON:  Previous exam(s). FINDINGS: Status post  excision of the left breast. The radioactive seed and biopsy marker clip are present and completely intact. IMPRESSION: Specimen radiograph of the left breast. Electronically Signed   By: Drew  Davis M.D.   On: 07/12/2019 13:01   MM LT RADIOACTIVE SEED LOC MAMMO GUIDE  Result Date: 07/11/2019 CLINICAL DATA:  63-year-old patient presents for radioactive seed localization prior to lumpectomy for left breast cancer. The ribbon shaped biopsy clip was approximately 1 cm medially displaced on the post biopsy clip mammogram from June 06, 2019 and after the biopsy of the small biopsy mass was obscured by the post biopsy changes. Therefore, today prior to beginning the procedure CC and MLO views of the whole breast with tomography were performed as part of the procedure. EXAM: MAMMOGRAPHIC GUIDED RADIOACTIVE SEED LOCALIZATION OF THE LEFT BREAST COMPARISON:  Previous exam(s). FINDINGS: Patient presents for radioactive seed localization prior to lumpectomy. I met with the patient and we discussed the procedure of seed localization including benefits and alternatives. We discussed the high likelihood of a successful procedure. We discussed the risks of the procedure including infection, bleeding, tissue injury and further surgery. We discussed the low dose of radioactivity involved in the procedure. Informed, written consent was given. The usual time-out protocol was performed immediately prior to the procedure. Whole breast CC and MLO views performed prior to the procedure and show the small mass in the anterior third of the upper outer left breast. The previously seen post biopsy changes on the post biopsy mammogram from June 06, 2019 have nearly completely resolved. The ribbon shaped biopsy clip is now positioned slightly closer to the mass than it was on the post biopsy clip marker mammogram on the day of the biopsy June 06, 2019. Using mammographic guidance, sterile technique, 1% lidocaine and an I-125  radioactive seed, the small mass in the upper-outer quadrant of the left breast and the adjacent ribbon shaped biopsy clip were localized using a superior approach. The follow-up mammogram images confirm the seed in the expected location and were marked for Dr. Blackman. Follow-up survey of the patient confirms presence of the radioactive seed. Order number of I-125 seed:  [id number]. Total activity:  0.246 millicuries reference Date: 04 June 2019 The patient tolerated the procedure well and was [facility]. She was given instructions regarding seed removal. IMPRESSION: Radioactive seed localization left breast. No apparent complications. Electronically Signed   By: Susan  Turner M.D.   On: 07/11/2019 12:31    Impression/Plan: Left Breast Cancer  We discussed adjuvant radiotherapy today.  I recommend four weeks directed at the left breast in order to reduce the risk of locoregional recurrence by 2/3.  The risks, benefits and side effects of this treatment were discussed in detail.  She understands that radiotherapy is associated with skin irritation and fatigue in the acute setting. Late effects can include cosmetic changes and rare injury to internal organs.  She is enthusiastic about   proceeding with treatment. A consent form has been signed and placed in her chart.   Proceed with simulation today.  She is pleased with this plan.  Plan to start treatment on August 13, 2019.  On date of service, in total, I spent 25 minutes on this encounter.  Patient was seen in person. _____________________________________   Sarah Squire, MD   This document serves as a record of services personally performed by Sarah Squire, MD. It was created on her behalf by Katie Daubenspeck, a trained medical scribe. The creation of this record is based on the scribe's personal observations and the provider's statements to them. This document has been checked and approved by the attending provider.  

## 2019-08-03 ENCOUNTER — Encounter: Payer: Self-pay | Admitting: Radiation Oncology

## 2019-08-03 ENCOUNTER — Other Ambulatory Visit: Payer: Self-pay

## 2019-08-03 ENCOUNTER — Ambulatory Visit
Admission: RE | Admit: 2019-08-03 | Discharge: 2019-08-03 | Disposition: A | Discharge status: Home / Self Care | Payer: PRIVATE HEALTH INSURANCE | Source: Ambulatory Visit | Attending: Radiation Oncology | Admitting: Radiation Oncology

## 2019-08-03 VITALS — BP 142/89 | HR 63 | Temp 98.7°F | Resp 18 | Wt 220.0 lb

## 2019-08-03 DIAGNOSIS — C50412 Malignant neoplasm of upper-outer quadrant of left female breast: Secondary | ICD-10-CM

## 2019-08-03 DIAGNOSIS — Z170 Estrogen receptor positive status [ER+]: Secondary | ICD-10-CM | POA: Insufficient documentation

## 2019-08-03 DIAGNOSIS — Z79899 Other long term (current) drug therapy: Secondary | ICD-10-CM | POA: Insufficient documentation

## 2019-08-03 DIAGNOSIS — Z510 Encounter for antineoplastic radiation therapy: Secondary | ICD-10-CM | POA: Diagnosis present

## 2019-08-03 NOTE — Progress Notes (Addendum)
  Radiation Oncology         [phone] ________________________________  Name: Kristen Gibson MRN: [id number]  Date: 08/03/2019  DOB: 1955/11/25  SIMULATION AND TREATMENT PLANNING NOTE       Outpatient  DIAGNOSIS:     ICD-10-CM   1. Malignant neoplasm of upper-outer quadrant of left breast in female, estrogen receptor positive ([facility])  C50.412    Z17.0     NARRATIVE:  The patient was brought to the [facility].  Identity was confirmed.  All relevant records and images related to the planned course of therapy were reviewed.  The patient freely provided informed written consent to proceed with treatment after reviewing the details related to the planned course of therapy. The consent form was witnessed and verified by the simulation staff.    Then, the patient was set-up in a stable reproducible supine position for radiation therapy with her ipsilateral arm over her head, and her upper body secured in a custom-made Vac-lok device.  CT images were obtained.  Surface markings were placed.  The CT images were loaded into the planning software.     TREATMENT PLANNING NOTE: Treatment planning then occurred.  The radiation prescription was entered and confirmed.     A total of 3 medically necessary complex treatment devices were fabricated and supervised by me: 2 fields with MLCs for custom blocks to protect heart, and lungs;  and, a Vac-lok. MORE COMPLEX DEVICES MAY BE MADE IN DOSIMETRY FOR FIELD IN FIELD BEAMS FOR DOSE HOMOGENEITY.  I have requested : 3D Simulation which is medically necessary to give adequate dose to at risk tissues while sparing lungs and heart.  I have requested a DVH of the following structures: lungs, heart, left lumpectomy cavity.    The patient will receive 40.05 Gy in 15 fractions to the left breast with 2 tangential fields. This will  be followed by a boost.  Optical Surface Tracking Plan:  Since intensity modulated radiotherapy (IMRT) and 3D  conformal radiation treatment methods are predicated on accurate and precise positioning for treatment, intrafraction motion monitoring is medically necessary to ensure accurate and safe treatment delivery. The ability to quantify intrafraction motion without excessive ionizing radiation dose can only be performed with optical surface tracking. Accordingly, surface imaging offers the opportunity to obtain 3D measurements of patient position throughout IMRT and 3D treatments without excessive radiation exposure. I am ordering optical surface tracking for this patient's upcoming course of radiotherapy.  ________________________________   Reference:  Ursula Alert, J, et al. Surface imaging-based analysis of intrafraction motion for breast radiotherapy patients.Journal of [employer], n. 6, nov. 2014. ISSN [id number].  Available at: <http://www.jacmp.org/index.php/jacmp/article/view/4957>.   Of note: special care is taken to minimize radiation overlap given her previous treatments to the right breast.  -----------------------------------  [name], MD

## 2019-08-06 ENCOUNTER — Encounter: Payer: Self-pay | Admitting: *Deleted

## 2019-08-06 DIAGNOSIS — C50412 Malignant neoplasm of upper-outer quadrant of left female breast: Secondary | ICD-10-CM | POA: Diagnosis present

## 2019-08-06 DIAGNOSIS — Z170 Estrogen receptor positive status [ER+]: Secondary | ICD-10-CM | POA: Insufficient documentation

## 2019-08-06 DIAGNOSIS — Z79899 Other long term (current) drug therapy: Secondary | ICD-10-CM | POA: Insufficient documentation

## 2019-08-06 DIAGNOSIS — Z510 Encounter for antineoplastic radiation therapy: Secondary | ICD-10-CM | POA: Insufficient documentation

## 2019-08-09 ENCOUNTER — Other Ambulatory Visit: Payer: Self-pay

## 2019-08-09 ENCOUNTER — Ambulatory Visit
Admission: RE | Admit: 2019-08-09 | Discharge: 2019-08-09 | Disposition: A | Discharge status: Home / Self Care | Payer: PRIVATE HEALTH INSURANCE | Source: Ambulatory Visit | Attending: Internal Medicine | Admitting: Internal Medicine

## 2019-08-09 DIAGNOSIS — R911 Solitary pulmonary nodule: Secondary | ICD-10-CM

## 2019-08-13 ENCOUNTER — Other Ambulatory Visit: Payer: Self-pay

## 2019-08-13 ENCOUNTER — Ambulatory Visit
Admission: RE | Admit: 2019-08-13 | Discharge: 2019-08-13 | Disposition: A | Discharge status: Home / Self Care | Payer: PRIVATE HEALTH INSURANCE | Source: Ambulatory Visit | Attending: Radiation Oncology | Admitting: Radiation Oncology

## 2019-08-13 DIAGNOSIS — C50412 Malignant neoplasm of upper-outer quadrant of left female breast: Secondary | ICD-10-CM

## 2019-08-13 DIAGNOSIS — Z510 Encounter for antineoplastic radiation therapy: Secondary | ICD-10-CM | POA: Diagnosis not present

## 2019-08-13 MED ORDER — ALRA NON-METALLIC DEODORANT (RAD-ONC)
1.0000 "application " | Freq: Once | TOPICAL | Status: AC
  Administered 2019-08-13: 1 via TOPICAL

## 2019-08-13 MED ORDER — RADIAPLEXRX EX GEL: Freq: Once | CUTANEOUS | Status: AC

## 2019-08-14 ENCOUNTER — Ambulatory Visit
Admission: RE | Admit: 2019-08-14 | Discharge: 2019-08-14 | Disposition: A | Discharge status: Home / Self Care | Payer: PRIVATE HEALTH INSURANCE | Source: Ambulatory Visit | Attending: Radiation Oncology | Admitting: Radiation Oncology

## 2019-08-14 ENCOUNTER — Other Ambulatory Visit: Payer: Self-pay

## 2019-08-14 DIAGNOSIS — Z510 Encounter for antineoplastic radiation therapy: Secondary | ICD-10-CM | POA: Diagnosis not present

## 2019-08-15 ENCOUNTER — Other Ambulatory Visit: Payer: Self-pay

## 2019-08-15 ENCOUNTER — Ambulatory Visit
Admission: RE | Admit: 2019-08-15 | Discharge: 2019-08-15 | Disposition: A | Discharge status: Home / Self Care | Payer: PRIVATE HEALTH INSURANCE | Source: Ambulatory Visit | Attending: Radiation Oncology | Admitting: Radiation Oncology

## 2019-08-15 DIAGNOSIS — Z510 Encounter for antineoplastic radiation therapy: Secondary | ICD-10-CM | POA: Diagnosis not present

## 2019-08-16 ENCOUNTER — Other Ambulatory Visit: Payer: Self-pay

## 2019-08-16 ENCOUNTER — Ambulatory Visit
Admission: RE | Admit: 2019-08-16 | Discharge: 2019-08-16 | Disposition: A | Discharge status: Home / Self Care | Payer: PRIVATE HEALTH INSURANCE | Source: Ambulatory Visit | Attending: Radiation Oncology | Admitting: Radiation Oncology

## 2019-08-16 DIAGNOSIS — Z510 Encounter for antineoplastic radiation therapy: Secondary | ICD-10-CM | POA: Diagnosis not present

## 2019-08-17 ENCOUNTER — Other Ambulatory Visit: Payer: Self-pay

## 2019-08-17 ENCOUNTER — Ambulatory Visit
Admission: RE | Admit: 2019-08-17 | Discharge: 2019-08-17 | Disposition: A | Discharge status: Home / Self Care | Payer: PRIVATE HEALTH INSURANCE | Source: Ambulatory Visit | Attending: Radiation Oncology | Admitting: Radiation Oncology

## 2019-08-17 DIAGNOSIS — Z510 Encounter for antineoplastic radiation therapy: Secondary | ICD-10-CM | POA: Diagnosis not present

## 2019-08-20 ENCOUNTER — Other Ambulatory Visit: Payer: Self-pay

## 2019-08-20 ENCOUNTER — Ambulatory Visit
Admission: RE | Admit: 2019-08-20 | Discharge: 2019-08-20 | Disposition: A | Discharge status: Home / Self Care | Payer: PRIVATE HEALTH INSURANCE | Source: Ambulatory Visit | Attending: Radiation Oncology | Admitting: Radiation Oncology

## 2019-08-20 DIAGNOSIS — Z510 Encounter for antineoplastic radiation therapy: Secondary | ICD-10-CM | POA: Diagnosis not present

## 2019-08-21 ENCOUNTER — Ambulatory Visit
Admission: RE | Admit: 2019-08-21 | Discharge: 2019-08-21 | Disposition: A | Discharge status: Home / Self Care | Payer: PRIVATE HEALTH INSURANCE | Source: Ambulatory Visit | Attending: Radiation Oncology | Admitting: Radiation Oncology

## 2019-08-21 ENCOUNTER — Other Ambulatory Visit: Payer: Self-pay

## 2019-08-21 DIAGNOSIS — Z510 Encounter for antineoplastic radiation therapy: Secondary | ICD-10-CM | POA: Diagnosis not present

## 2019-08-22 ENCOUNTER — Ambulatory Visit
Admission: RE | Admit: 2019-08-22 | Discharge: 2019-08-22 | Disposition: A | Discharge status: Home / Self Care | Payer: PRIVATE HEALTH INSURANCE | Source: Ambulatory Visit | Attending: Radiation Oncology | Admitting: Radiation Oncology

## 2019-08-22 ENCOUNTER — Other Ambulatory Visit: Payer: Self-pay

## 2019-08-22 DIAGNOSIS — Z510 Encounter for antineoplastic radiation therapy: Secondary | ICD-10-CM | POA: Diagnosis not present

## 2019-08-23 ENCOUNTER — Ambulatory Visit: Payer: PRIVATE HEALTH INSURANCE

## 2019-08-24 ENCOUNTER — Ambulatory Visit
Admission: RE | Admit: 2019-08-24 | Discharge: 2019-08-24 | Disposition: A | Discharge status: Home / Self Care | Payer: PRIVATE HEALTH INSURANCE | Source: Ambulatory Visit | Attending: Radiation Oncology | Admitting: Radiation Oncology

## 2019-08-24 ENCOUNTER — Other Ambulatory Visit: Payer: Self-pay

## 2019-08-24 DIAGNOSIS — Z510 Encounter for antineoplastic radiation therapy: Secondary | ICD-10-CM | POA: Diagnosis not present

## 2019-08-27 ENCOUNTER — Ambulatory Visit: Payer: PRIVATE HEALTH INSURANCE | Admitting: Radiation Oncology

## 2019-08-27 ENCOUNTER — Other Ambulatory Visit: Payer: Self-pay

## 2019-08-27 ENCOUNTER — Ambulatory Visit
Admission: RE | Admit: 2019-08-27 | Discharge: 2019-08-27 | Disposition: A | Discharge status: Home / Self Care | Payer: PRIVATE HEALTH INSURANCE | Source: Ambulatory Visit | Attending: Radiation Oncology | Admitting: Radiation Oncology

## 2019-08-27 DIAGNOSIS — Z510 Encounter for antineoplastic radiation therapy: Secondary | ICD-10-CM | POA: Diagnosis not present

## 2019-08-28 ENCOUNTER — Ambulatory Visit
Admission: RE | Admit: 2019-08-28 | Discharge: 2019-08-28 | Disposition: A | Discharge status: Home / Self Care | Payer: PRIVATE HEALTH INSURANCE | Source: Ambulatory Visit | Attending: Radiation Oncology | Admitting: Radiation Oncology

## 2019-08-28 DIAGNOSIS — Z510 Encounter for antineoplastic radiation therapy: Secondary | ICD-10-CM | POA: Diagnosis not present

## 2019-08-29 ENCOUNTER — Ambulatory Visit
Admission: RE | Admit: 2019-08-29 | Discharge: 2019-08-29 | Disposition: A | Discharge status: Home / Self Care | Payer: PRIVATE HEALTH INSURANCE | Source: Ambulatory Visit | Attending: Radiation Oncology | Admitting: Radiation Oncology

## 2019-08-29 ENCOUNTER — Other Ambulatory Visit: Payer: Self-pay

## 2019-08-29 DIAGNOSIS — Z510 Encounter for antineoplastic radiation therapy: Secondary | ICD-10-CM | POA: Diagnosis not present

## 2019-08-30 ENCOUNTER — Other Ambulatory Visit: Payer: Self-pay

## 2019-08-30 ENCOUNTER — Ambulatory Visit
Admission: RE | Admit: 2019-08-30 | Discharge: 2019-08-30 | Disposition: A | Discharge status: Home / Self Care | Payer: PRIVATE HEALTH INSURANCE | Source: Ambulatory Visit | Attending: Radiation Oncology | Admitting: Radiation Oncology

## 2019-08-30 DIAGNOSIS — Z510 Encounter for antineoplastic radiation therapy: Secondary | ICD-10-CM | POA: Diagnosis not present

## 2019-08-31 ENCOUNTER — Other Ambulatory Visit: Payer: Self-pay

## 2019-08-31 ENCOUNTER — Ambulatory Visit
Admission: RE | Admit: 2019-08-31 | Discharge: 2019-08-31 | Disposition: A | Discharge status: Home / Self Care | Payer: PRIVATE HEALTH INSURANCE | Source: Ambulatory Visit | Attending: Radiation Oncology | Admitting: Radiation Oncology

## 2019-08-31 DIAGNOSIS — Z510 Encounter for antineoplastic radiation therapy: Secondary | ICD-10-CM | POA: Diagnosis not present

## 2019-09-03 ENCOUNTER — Ambulatory Visit: Payer: PRIVATE HEALTH INSURANCE

## 2019-09-03 ENCOUNTER — Other Ambulatory Visit: Payer: Self-pay

## 2019-09-03 ENCOUNTER — Ambulatory Visit
Admission: RE | Admit: 2019-09-03 | Discharge: 2019-09-03 | Disposition: A | Discharge status: Home / Self Care | Payer: PRIVATE HEALTH INSURANCE | Source: Ambulatory Visit | Attending: Radiation Oncology | Admitting: Radiation Oncology

## 2019-09-03 DIAGNOSIS — F329 Major depressive disorder, single episode, unspecified: Secondary | ICD-10-CM | POA: Diagnosis not present

## 2019-09-03 DIAGNOSIS — F419 Anxiety disorder, unspecified: Secondary | ICD-10-CM | POA: Insufficient documentation

## 2019-09-03 DIAGNOSIS — C50412 Malignant neoplasm of upper-outer quadrant of left female breast: Secondary | ICD-10-CM | POA: Insufficient documentation

## 2019-09-03 DIAGNOSIS — C50811 Malignant neoplasm of overlapping sites of right female breast: Secondary | ICD-10-CM

## 2019-09-03 DIAGNOSIS — Z87891 Personal history of nicotine dependence: Secondary | ICD-10-CM | POA: Insufficient documentation

## 2019-09-03 DIAGNOSIS — Z833 Family history of diabetes mellitus: Secondary | ICD-10-CM | POA: Diagnosis not present

## 2019-09-03 DIAGNOSIS — Z800 Family history of malignant neoplasm of digestive organs: Secondary | ICD-10-CM | POA: Insufficient documentation

## 2019-09-03 DIAGNOSIS — Z79899 Other long term (current) drug therapy: Secondary | ICD-10-CM | POA: Insufficient documentation

## 2019-09-03 DIAGNOSIS — Z170 Estrogen receptor positive status [ER+]: Secondary | ICD-10-CM | POA: Insufficient documentation

## 2019-09-03 DIAGNOSIS — Z510 Encounter for antineoplastic radiation therapy: Secondary | ICD-10-CM | POA: Insufficient documentation

## 2019-09-03 DIAGNOSIS — Z79811 Long term (current) use of aromatase inhibitors: Secondary | ICD-10-CM | POA: Diagnosis not present

## 2019-09-03 DIAGNOSIS — Z8051 Family history of malignant neoplasm of kidney: Secondary | ICD-10-CM | POA: Diagnosis not present

## 2019-09-03 DIAGNOSIS — Z791 Long term (current) use of non-steroidal anti-inflammatories (NSAID): Secondary | ICD-10-CM | POA: Diagnosis not present

## 2019-09-03 DIAGNOSIS — E039 Hypothyroidism, unspecified: Secondary | ICD-10-CM | POA: Insufficient documentation

## 2019-09-03 MED ORDER — SONAFINE EX EMUL
1.0000 "application " | Freq: Two times a day (BID) | CUTANEOUS | Status: DC
  Administered 2019-09-03: 1 via TOPICAL

## 2019-09-03 MED ORDER — ALRA NON-METALLIC DEODORANT (RAD-ONC)
1.0000 "application " | Freq: Once | TOPICAL | Status: AC
  Administered 2019-09-03: 1 via TOPICAL

## 2019-09-04 ENCOUNTER — Other Ambulatory Visit: Payer: Self-pay

## 2019-09-04 ENCOUNTER — Ambulatory Visit: Payer: PRIVATE HEALTH INSURANCE

## 2019-09-04 ENCOUNTER — Ambulatory Visit
Admission: RE | Admit: 2019-09-04 | Discharge: 2019-09-04 | Disposition: A | Discharge status: Home / Self Care | Payer: PRIVATE HEALTH INSURANCE | Source: Ambulatory Visit | Attending: Radiation Oncology | Admitting: Radiation Oncology

## 2019-09-04 DIAGNOSIS — Z510 Encounter for antineoplastic radiation therapy: Secondary | ICD-10-CM | POA: Diagnosis not present

## 2019-09-04 NOTE — Progress Notes (Signed)
[facility]  [phone] [phone]     ID: Beverlyn K Mccardle DOB: 10/15/1955  MR#: [id number]  CSN#:[id number]  Patient Care Team: Russo, John, MD as PCP - General (Internal Medicine) Blackman, Douglas, MD as Consulting Physician (General Surgery) Riddick Nuon C, MD as Consulting Physician (Oncology) Squire, Sarah, MD as Consulting Physician (Radiation Oncology) Squire, Sarah, MD as Attending Physician (Radiation Oncology) Stuart, Dawn C, RN as Oncology Nurse Navigator Martini, Keisha N, RN as Oncology Nurse Navigator Guage Efferson C Ortencia Askari, MD OTHER MD:  CHIEF COMPLAINT:  Bilateral estrogen receptor positive breast cancers  CURRENT TREATMENT: Completing adjuvant radiation therapy; to start anastrozole 10/04/2019   INTERVAL HISTORY: Mercedes returns today for follow up of her bilateral estrogen receptor positive breast cancers. She was evaluated in the breast cancer clinic on 06/19/2019 for her new left breast cancer.   She opted to proceed with left lumpectomy and sentinel lymph node biopsy on 07/12/2019 under Dr. Blackman. Pathology from the procedure ([id number]) showed: invasive ductal carcinoma, grade 1, 1.0 cm (measurement includes biopsy site reaction with about 30% representing residual carcinoma); ductal carcinoma in situ; margins uninvolved.  One lymph node was removed, which was negative for carcinoma (0/1).  She was referred back to Dr. Squire on 08/03/2019 for radiation therapy. She began treatment on 08/13/2019 and is scheduled to finish on 09/10/2019.  She also underwent chest CT on 08/09/2019 for follow up of a lung nodule. This showed: stable 6 mm right middle lobe pulmonary nodule, possibly intrapulmonary lymph node, consider 24-month follow up CT to confirm stability.   REVIEW OF SYSTEMS: Ambri did quite well with the surgery, with no significant bleeding or swelling problems.  She had practically no pain.  She is also doing very well  with radiation, so far with no peeling, though she is developing some redness and a bit of a rash on the medial aspect of the left breast.  She was able to continue to work full-time right through all the treatments and she is still exercising regularly, walking and doing gardening.  A detailed review of systems today was otherwise noncontributory   HISTORY OF CURRENT ILLNESS: From the original intake note:  Cailie K Armato has a history of right breast cancer detailed below.  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  She was prescribed tamoxifen but never took it regularly or perhaps at all and after the June 2014 visit here she was lost to follow-up.   More recently she had routine screening mammography on 05/25/2019 showing a possible abnormality in the left breast. She underwent left diagnostic mammography with tomography and left breast ultrasonography at [facility] on 06/06/2019 showing: breast density category B; indeterminate 4 mm mass in the left breast at 2 o'clock; no abnormal left axillary lymph nodes.  Accordingly on 06/06/2019 she proceeded to biopsy of the left breast area in question. The pathology from this procedure ([id number]) showed: invasive ductal carcinoma, grade 1, with ductal carcinoma in situ. Prognostic indicators significant for: estrogen receptor, 95% positive and progesterone receptor, 95% positive, both with strong staining intensity. Proliferation marker Ki67 at 10%. HER2 equivocal by immunohistochemistry (2+), but negative by fluorescent in situ hybridization with a signals ratio 1.24 and number per cell 2.05.  The patient's subsequent history is as detailed below.   History of right sided breast cancer: The patient had been monitored with short interval mammography over two years for unexplained bruising   of the right breast. In October 2012  a small spiculated mass was  noted in the outer portion of the right breast posteriorly.  Ultrasound confirmed a small irregularly-marginated lesion at the 9 o'clock position 9 cm from the right nipple measuring 4 x 4 x 5 mm.  The Right axilla was unremarkable. Ultrasound-guided core biopsy on 04/12/2011 revealed a low-grade ductal carcinoma that was estrogen receptor positive at 60%, progesterone receptor positive at 8%, Ki-67 was 5% and HER2/neu negative with a ratio by CISH of 1.29.  Subseuently bilateral breast MRI showed a 9 x 8 x 6 mm mass deep in the upper outer quadrant of the right breast.  No other abnormalities were noted. The patient underwent definitive Right lumpectomy and sentinel lymph node sampling 05/13/2011 with results and subsequent history as noted below.   PAST MEDICAL HISTORY: Past Medical History:  Diagnosis Date  . Allergy   . Anxiety    no medications for anxiety  . Arthritis   . Breast cancer ([place])    2012  . Cancer ([place]) 05/2011   right breast  . GERD (gastroesophageal reflux disease)   . Hypothyroid   . Personal history of radiation therapy    2012 right breast  . Psoriasis   . S/P radiation therapy 06/17/2011 - 08/04/2011   Right Breast - 45 Gy/25 fractions whole breast, and 16 Gy/8 fractions for boost    PAST SURGICAL HISTORY: Past Surgical History:  Procedure Laterality Date  . BREAST LUMPECTOMY Right    2012  . BREAST LUMPECTOMY WITH RADIOACTIVE SEED AND SENTINEL LYMPH NODE BIOPSY Left 07/12/2019   Procedure: LEFT BREAST LUMPECTOMY WITH RADIOACTIVE SEED AND SENTINEL LYMPH NODE BIOPSY;  Surgeon: Coralie Keens, MD;  Location: [place];  Service: General;  Laterality: Left;  . BREAST SURGERY  05/13/11   Rt Breast Lumpectomy with sentinel node biospy, invasive ductal ca, 0/2 nodes   . TONSILLECTOMY    . TUBAL LIGATION     At age 51    FAMILY HISTORY: Family History  Problem Relation Age of Onset  . Cancer Mother        kidney  . Kidney cancer Mother   .  Cancer Maternal Grandmother        colon  . Colon cancer Maternal Grandmother   . Cancer Paternal Grandmother        colon  . Colon cancer Paternal Grandmother   . Diabetes Neg Hx   . Rectal cancer Neg Hx   . Stomach cancer Neg Hx    The patient's father died with significant dementia at age 67..  Mother died at the age of 21  from kidney cancer.The patient had no sisters. One brother is alive and well as of December 2020.  The patient's maternal grandmother had colon cancer, diagnosed at unknown age, and paternal grandmother had colon cancer diagnosed at age 71. There is no history of breast or ovarian cancer in the family to her knowledge.   GYNECOLOGIC HISTORY:  Patient's last menstrual period was 10/08/2010. Menarche: 64 years old [place] P 0 LMP 10/2010 Contraceptive briefly HRT no  Hysterectomy? no BSO? 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  She lives alone, her dog having recently died.  The patient's brother works in a warehouse, third shift.     ADVANCED DIRECTIVES: Patient's brother [name] is her healthcare power of attorney.  He can be reached at [phone].  HEALTH MAINTENANCE: Social History   Tobacco Use  . Smoking status: Former Smoker    Packs/day: 0.30    Years: 30.00    Pack years: 9.00    Types: Cigarettes    Quit date: 05/09/2009    Years since quitting: 10.3  . Smokeless tobacco: Never Used  Substance Use Topics  . Alcohol use: No  . Drug use: No     Colonoscopy: 02/2018 (Dr. Perry), repeated every 3 years  PAP: 08/2017, negative  Bone density:    No Known Allergies  Current Outpatient Medications  Medication Sig Dispense Refill  . ALPRAZolam (XANAX) 0.5 MG tablet Take 0.25 mg by mouth at bedtime as needed.    . celecoxib (CELEBREX) 200 MG capsule Take 200 mg by mouth 2 (two) times daily.    . Cholecalciferol (VITAMIN D) 1000 UNITS capsule Take 1,000 Units by mouth  daily.      . FINACEA 15 % cream     . levothyroxine (SYNTHROID, LEVOTHROID) 137 MCG tablet TAKE 1 TABLET BY MOUTH EVERY DAY 90 tablet 0  . minocycline (MINOCIN,DYNACIN) 100 MG capsule Take 100 mg by mouth daily as needed. For rosacea     . omeprazole (PRILOSEC) 20 MG capsule Take 20 mg by mouth daily.    . OVER THE COUNTER MEDICATION Vitamin B 12 5000 mcg two tablets daily.    . oxyCODONE (OXY IR/ROXICODONE) 5 MG immediate release tablet Take 1 tablet (5 mg total) by mouth every 6 (six) hours as needed for moderate pain or severe pain. (Patient not taking: Reported on 08/03/2019) 25 tablet 0  . tamoxifen (NOLVADEX) 20 MG tablet Take 1 tablet (20 mg total) by mouth daily. (Patient not taking: Reported on 02/24/2018) 90 tablet 12   No current facility-administered medications for this visit.    OBJECTIVE: Middle-aged white woman who appears younger than stated age  Vitals:   09/05/19 1621  BP: (!) 146/76  Pulse: 77  Resp: 18  Temp: (!) 77 F (25 C)  SpO2: 100%     Body mass index is 37.8 kg/m.   Wt Readings from Last 3 Encounters:  09/05/19 220 lb 3.2 oz (99.9 kg)  08/03/19 220 lb (99.8 kg)  07/12/19 218 lb 11.1 oz (99.2 kg)      ECOG FS:1 - Symptomatic but completely ambulatory  Sclerae unicteric, EOMs intact Wearing a mask No cervical or supraclavicular adenopathy Lungs no rales or rhonchi Heart regular rate and rhythm Abd soft, nontender, positive bowel sounds MSK no focal spinal tenderness, no upper extremity lymphedema Neuro: nonfocal, well oriented, appropriate affect Breasts: The right breast is unremarkable.  The left breast is status post lumpectomy.  The cosmetic result is excellent.  The breast is also receiving radiation, with mild erythema and a mild palpable erythematous scattered rash in the upper medial aspect of the left breast.  Both axillae are benign.   LAB RESULTS:  CMP     Component Value Date/Time   NA 141 09/05/2019 1558   NA 142 12/12/2012 0847     K 4.0 09/05/2019 1558   K 4.1 12/12/2012 0847   CL 107 09/05/2019 1558   CL 106 12/12/2012 0847   CO2 24 09/05/2019 1558   CO2 26 12/12/2012 0847   GLUCOSE 108 (H) 09/05/2019 1558   GLUCOSE 128 (H) 12/12/2012 0847   BUN 21 09/05/2019 1558   BUN 11.2 12/12/2012 0847   CREATININE 0.78 09/05/2019 1558   CREATININE 0.78 06/19/2019 1524   CREATININE 0.9 12/12/2012   0847   CALCIUM 9.1 09/05/2019 1558   CALCIUM 8.8 12/12/2012 0847   PROT 6.8 09/05/2019 1558   PROT 6.7 12/12/2012 0847   ALBUMIN 3.7 09/05/2019 1558   ALBUMIN 3.3 (L) 12/12/2012 0847   AST 14 (L) 09/05/2019 1558   AST 14 (L) 06/19/2019 1524   AST 12 12/12/2012 0847   ALT 14 09/05/2019 1558   ALT 13 06/19/2019 1524   ALT 13 12/12/2012 0847   ALKPHOS 107 09/05/2019 1558   ALKPHOS 83 12/12/2012 0847   BILITOT 0.3 09/05/2019 1558   BILITOT 0.3 06/19/2019 1524   BILITOT 0.46 12/12/2012 0847   GFRNONAA >60 09/05/2019 1558   GFRNONAA >60 06/19/2019 1524   GFRAA >60 09/05/2019 1558   GFRAA >60 06/19/2019 1524    No results found for: TOTALPROTELP, ALBUMINELP, A1GS, A2GS, BETS, BETA2SER, GAMS, MSPIKE, SPEI  No results found for: KPAFRELGTCHN, LAMBDASER, KAPLAMBRATIO  Lab Results  Component Value Date   WBC 10.7 (H) 09/05/2019   NEUTROABS 7.2 09/05/2019   HGB 13.5 09/05/2019   HCT 40.4 09/05/2019   MCV 92.4 09/05/2019   PLT 262 09/05/2019    Lab Results  Component Value Date   LABCA2 20 04/21/2011    No components found for: [id number]  No results for input(s): INR in the last 168 hours.  Lab Results  Component Value Date   LABCA2 20 04/21/2011    No results found for: PJA250  No results found for: NLZ767  No results found for: HAL937  No results found for: CA2729  No components found for: HGQUANT  No results found for: CEA1 / No results found for: CEA1   No results found for: AFPTUMOR  No results found for: CHROMOGRNA  No results found for: HGBA, HGBA2QUANT, HGBFQUANT,  HGBSQUAN (Hemoglobinopathy evaluation)   No results found for: LDH  No results found for: IRON, TIBC, IRONPCTSAT (Iron and TIBC)  No results found for: FERRITIN  Urinalysis No results found for: COLORURINE, APPEARANCEUR, LABSPEC, PHURINE, GLUCOSEU, HGBUR, BILIRUBINUR, KETONESUR, PROTEINUR, UROBILINOGEN, NITRITE, LEUKOCYTESUR   STUDIES: CT CHEST WO CONTRAST  Result Date: 08/10/2019 CLINICAL DATA:  Lung nodule, follow-up. History of breast carcinoma EXAM: CT CHEST WITHOUT CONTRAST TECHNIQUE: Multidetector CT imaging of the chest was performed following the standard protocol without IV contrast. COMPARISON:  02/03/2018 FINDINGS: Cardiovascular: Heart size normal. No significant pericardial effusion. Coronary calcifications. Aortic Atherosclerosis (ICD10-170.0). Mediastinum/Nodes: Small hiatal hernia. Calcified right hilar and subcarinal lymph nodes consistent with old granulomatous disease. No new hilar or mediastinal adenopathy. Lungs/Pleura: No pleural effusion. No pneumothorax. 6 mm right middle lobe nodule adjacent to the minor fissure (Im79,Se3) , stable. Calcified right upper and middle lobe granulomas. Lungs otherwise clear. Upper Abdomen: No acute findings. Musculoskeletal: Anterior vertebral endplate spurring at multiple levels in the mid and lower thoracic spine. Surgical clips in the right breast. IMPRESSION: 1. Stable 6 mm right middle lobe pulmonary nodule, possibly intrapulmonary lymph node. Consider 24 month follow-up CT without contrast to confirm stability. 2. Coronary and Aortic Atherosclerosis (ICD10-170.0). Electronically Signed   By: Lucrezia Europe M.D.   On: 08/10/2019 08:49    ELIGIBLE FOR AVAILABLE RESEARCH PROTOCOL: no  ASSESSMENT: 64 y.o. [place] woman with Adderall breast cancer history as follows:   (1) status post right lumpectomy and sentinel lymph node biopsy 05/13/2011 for a T1c N0, stage IA invasive ductal carcinoma, grade 1, estrogen receptor 69% and progesterone  receptor 84% positive, with an MIB-1 of 6%, and no HER-2 amplification.   (a) completed adjuvant radiation January  2013   (b) tamoxifen started January 2013 and taken irregularly for up to 3 years per patient recollection  (2) status post left breast upper outer quadrant biopsy 06/06/2019 for a clinical T1a N0, stage IA's of ductal carcinoma, grade 1, estrogen and progesterone receptor positive, HER-2 nonamplified, with an MIB-1 of 10%  (3) genetics testing pending  (4) status post left lumpectomy and sentinel lymph node sampling 07/12/2019 for a pT1b pN0, stage IA invasive ductal carcinoma, grade 1, with negative margins  (a) the single sentinel lymph node removed was negative  (5) adjuvant radiation to be completed 09/10/2019  (6) to start anastrozole 10/04/2019.   PLAN: Ananiah will complete local treatment for her cancer, namely radiation and surgery, 09/10/2019.  She understands there will be a very low risk of local recurrence and also a very low risk of this breast cancer recurring outside the breast.  There will be some risk of her developing a third breast cancer.  That risk approximates 1 %/year.  Each 1 of these 3 different risks can be cut essentially in half if she takes an antiestrogen for 5 years.  Accordingly we discussed the difference between tamoxifen and anastrozole in detail. She understands that anastrozole and the aromatase inhibitors in general work by blocking estrogen production. Accordingly vaginal dryness, decrease in bone density, and of course hot flashes can result. The aromatase inhibitors can also negatively affect the cholesterol profile, although that is a minor effect. One out of 5 women on aromatase inhibitors we will feel "old and achy". This arthralgia/myalgia syndrome, which resembles fibromyalgia clinically, does resolve with stopping the medications. Accordingly this is not a reason to not try an aromatase inhibitor but it is a frequent reason to stop it  (in other words 20% of women will not be able to tolerate these medications).  Tamoxifen on the other hand does not block estrogen production. It does not "take away a woman's estrogen". It blocks the estrogen receptor in breast cells. Like anastrozole, it can also cause hot flashes. As opposed to anastrozole, tamoxifen has many estrogen-like effects. It is technically an estrogen receptor modulator. This means that in some tissues tamoxifen works like estrogen-- for example it helps strengthen the bones. It tends to improve the cholesterol profile. It can cause thickening of the endometrial lining, and even endometrial polyps or rarely cancer of the uterus.(The risk of uterine cancer due to tamoxifen is one additional cancer per thousand women year). It can cause vaginal wetness or stickiness. It can cause blood clots through this estrogen-like effect--the risk of blood clots with tamoxifen is exactly the same as with birth control pills or hormone replacement.  Neither of these agents causes mood changes or weight gain, despite the popular belief that they can have these side effects. We have data from studies comparing either of these drugs with placebo, and in those cases the control group had the same amount of weight gain and depression as the group that took the drug.  After this discussion she opted for anastrozole which was also my recommendation.  She will have a brief "vacation" after radiation to get used to the post radiation new baseline and then she will start anastrozole 10/04/2019.  She will call with any unusual or bothersome side effects.  She will then have her new baseline mammogram the second week in July and see me the following week.    We also drew blood for genetics testing today.  One of our genetics counselors will be   contacting her for a formal consultation.  We should have those results within a month or so  Total encounter time today 35 minutes.*    Gustav C Magrinat,  MD   09/05/2019 4:40 PM Medical Oncology and Hematology [facility] [address] [place], [place] [address] Tel. [phone]    Fax. [phone]   This document serves as a record of services personally performed by Gustav Magrinat, MD. It was created on his behalf by Katie Daubenspeck, a trained medical scribe. The creation of this record is based on the scribe's personal observations and the provider's statements to them.   I, Gustav Magrinat MD, have reviewed the above documentation for accuracy and completeness, and I agree with the above.   *Total Encounter Time as defined by the Centers for Medicare and Medicaid Services includes, in addition to the face-to-face time of a patient visit (documented in the note above) non-face-to-face time: obtaining and reviewing outside history, ordering and reviewing medications, tests or procedures, care coordination (communications with other health care professionals or caregivers) and documentation in the medical record.  

## 2019-09-05 ENCOUNTER — Inpatient Hospital Stay: Payer: PRIVATE HEALTH INSURANCE | Attending: Oncology

## 2019-09-05 ENCOUNTER — Inpatient Hospital Stay (HOSPITAL_BASED_OUTPATIENT_CLINIC_OR_DEPARTMENT_OTHER): Payer: PRIVATE HEALTH INSURANCE | Admitting: Oncology

## 2019-09-05 ENCOUNTER — Other Ambulatory Visit: Payer: Self-pay

## 2019-09-05 ENCOUNTER — Other Ambulatory Visit: Payer: Self-pay | Admitting: Genetic Counselor

## 2019-09-05 ENCOUNTER — Ambulatory Visit
Admission: RE | Admit: 2019-09-05 | Discharge: 2019-09-05 | Disposition: A | Discharge status: Home / Self Care | Payer: PRIVATE HEALTH INSURANCE | Source: Ambulatory Visit | Attending: Radiation Oncology | Admitting: Radiation Oncology

## 2019-09-05 VITALS — BP 146/76 | HR 77 | Temp 77.0°F | Resp 18 | Ht 64.0 in | Wt 220.2 lb

## 2019-09-05 DIAGNOSIS — Z170 Estrogen receptor positive status [ER+]: Secondary | ICD-10-CM | POA: Diagnosis not present

## 2019-09-05 DIAGNOSIS — C50811 Malignant neoplasm of overlapping sites of right female breast: Secondary | ICD-10-CM

## 2019-09-05 DIAGNOSIS — C50412 Malignant neoplasm of upper-outer quadrant of left female breast: Secondary | ICD-10-CM | POA: Diagnosis not present

## 2019-09-05 DIAGNOSIS — Z510 Encounter for antineoplastic radiation therapy: Secondary | ICD-10-CM | POA: Diagnosis not present

## 2019-09-05 LAB — CBC WITH DIFFERENTIAL/PLATELET
Abs Immature Granulocytes: 0.02 10*3/uL (ref 0.00–0.07)
Basophils Absolute: 0.1 10*3/uL (ref 0.0–0.1)
Basophils Relative: 1 %
Eosinophils Absolute: 0.3 10*3/uL (ref 0.0–0.5)
Eosinophils Relative: 3 %
HCT: 40.4 % (ref 36.0–46.0)
Hemoglobin: 13.5 g/dL (ref 12.0–15.0)
Immature Granulocytes: 0 %
Lymphocytes Relative: 22 %
Lymphs Abs: 2.3 10*3/uL (ref 0.7–4.0)
MCH: 30.9 pg (ref 26.0–34.0)
MCHC: 33.4 g/dL (ref 30.0–36.0)
MCV: 92.4 fL (ref 80.0–100.0)
Monocytes Absolute: 0.8 10*3/uL (ref 0.1–1.0)
Monocytes Relative: 8 %
Neutro Abs: 7.2 10*3/uL (ref 1.7–7.7)
Neutrophils Relative %: 66 %
Platelets: 262 10*3/uL (ref 150–400)
RBC: 4.37 MIL/uL (ref 3.87–5.11)
RDW: 12.6 % (ref 11.5–15.5)
WBC: 10.7 10*3/uL — ABNORMAL HIGH (ref 4.0–10.5)
nRBC: 0 % (ref 0.0–0.2)

## 2019-09-05 LAB — COMPREHENSIVE METABOLIC PANEL
ALT: 14 U/L (ref 0–44)
AST: 14 U/L — ABNORMAL LOW (ref 15–41)
Albumin: 3.7 g/dL (ref 3.5–5.0)
Alkaline Phosphatase: 107 U/L (ref 38–126)
Anion gap: 10 (ref 5–15)
BUN: 21 mg/dL (ref 8–23)
CO2: 24 mmol/L (ref 22–32)
Calcium: 9.1 mg/dL (ref 8.9–10.3)
Chloride: 107 mmol/L (ref 98–111)
Creatinine, Ser: 0.78 mg/dL (ref 0.44–1.00)
GFR calc Af Amer: >60 mL/min (ref >60)
GFR calc non Af Amer: >60 mL/min (ref >60)
Glucose, Bld: 108 mg/dL — ABNORMAL HIGH (ref 70–99)
Potassium: 4 mmol/L (ref 3.5–5.1)
Sodium: 141 mmol/L (ref 135–145)
Total Bilirubin: 0.3 mg/dL (ref 0.3–1.2)
Total Protein: 6.8 g/dL (ref 6.5–8.1)

## 2019-09-05 MED ORDER — ANASTROZOLE 1 MG PO TABS: 1.0000 mg | ORAL_TABLET | Freq: Every day | ORAL | 4 refills | Status: DC

## 2019-09-06 ENCOUNTER — Telehealth: Payer: Self-pay | Admitting: Oncology

## 2019-09-06 ENCOUNTER — Other Ambulatory Visit: Payer: Self-pay

## 2019-09-06 ENCOUNTER — Ambulatory Visit
Admission: RE | Admit: 2019-09-06 | Discharge: 2019-09-06 | Disposition: A | Discharge status: Home / Self Care | Payer: PRIVATE HEALTH INSURANCE | Source: Ambulatory Visit | Attending: Radiation Oncology | Admitting: Radiation Oncology

## 2019-09-06 DIAGNOSIS — Z510 Encounter for antineoplastic radiation therapy: Secondary | ICD-10-CM | POA: Diagnosis not present

## 2019-09-06 LAB — GENETIC SCREENING ORDER

## 2019-09-06 NOTE — Telephone Encounter (Signed)
I left a message regarding schedule  

## 2019-09-07 ENCOUNTER — Ambulatory Visit: Payer: PRIVATE HEALTH INSURANCE

## 2019-09-07 ENCOUNTER — Other Ambulatory Visit: Payer: Self-pay

## 2019-09-07 ENCOUNTER — Ambulatory Visit
Admission: RE | Admit: 2019-09-07 | Discharge: 2019-09-07 | Disposition: A | Discharge status: Home / Self Care | Payer: PRIVATE HEALTH INSURANCE | Source: Ambulatory Visit | Attending: Radiation Oncology | Admitting: Radiation Oncology

## 2019-09-07 DIAGNOSIS — Z510 Encounter for antineoplastic radiation therapy: Secondary | ICD-10-CM | POA: Diagnosis not present

## 2019-09-10 ENCOUNTER — Ambulatory Visit
Admission: RE | Admit: 2019-09-10 | Discharge: 2019-09-10 | Disposition: A | Discharge status: Home / Self Care | Payer: PRIVATE HEALTH INSURANCE | Source: Ambulatory Visit | Attending: Radiation Oncology | Admitting: Radiation Oncology

## 2019-09-10 ENCOUNTER — Encounter: Payer: Self-pay | Admitting: Radiation Oncology

## 2019-09-10 ENCOUNTER — Encounter: Payer: Self-pay | Admitting: *Deleted

## 2019-09-10 ENCOUNTER — Other Ambulatory Visit: Payer: Self-pay

## 2019-09-10 DIAGNOSIS — Z510 Encounter for antineoplastic radiation therapy: Secondary | ICD-10-CM | POA: Diagnosis not present

## 2019-09-11 ENCOUNTER — Ambulatory Visit: Payer: PRIVATE HEALTH INSURANCE | Admitting: Oncology

## 2019-09-11 ENCOUNTER — Other Ambulatory Visit: Payer: PRIVATE HEALTH INSURANCE

## 2019-09-13 ENCOUNTER — Inpatient Hospital Stay: Payer: PRIVATE HEALTH INSURANCE | Admitting: Genetic Counselor

## 2019-10-12 ENCOUNTER — Other Ambulatory Visit: Payer: Self-pay | Admitting: Oncology

## 2019-10-17 ENCOUNTER — Telehealth: Payer: Self-pay

## 2019-10-17 MED ORDER — GABAPENTIN 300 MG PO CAPS: 300.0000 mg | ORAL_CAPSULE | Freq: Every day | ORAL | 2 refills | Status: DC

## 2019-10-17 NOTE — Telephone Encounter (Signed)
RN reviewed with MD.  New orders for Gabapentin 300mg  QHS.     Pt aware, Rx sent.  No further needs at this time.

## 2019-10-17 NOTE — Progress Notes (Signed)
I called the patient today about her upcoming follow-up appointment in radiation oncology.   Given the state of the COVID-19 pandemic, concerning case numbers in our community, and guidance from [facility], I offered a phone assessment with the patient to determine if coming to the clinic was necessary. She accepted.  I let the patient know that I had spoken with Dr. Isidore Moos, and she wanted them to know the importance of washing their hands for at least 20 seconds at a time, especially after going out in public, and before they eat.  Limit going out in public whenever possible. Do not touch your face, unless your hands are clean, such as when bathing. Get plenty of rest, eat well, and stay hydrated. Patient verbalized understanding and agreement.  The patient denies any symptomatic concerns.  Specifically, they report good healing of their skin in the radiation fields.  Skin is intact, still slightly discolored, but over all "doing very well". No other issues or complaints.  I recommended that she continue skin care by applying oil or lotion with vitamin E to the skin in the radiation fields, BID, for 2 more months.  Continue follow-up with medical oncology - follow-up is scheduled on 01/14/2020 with Dr. Lurline Del.  I explained that yearly mammograms are important for patients with intact breast tissue, and physical exams are important after mastectomy for patients that cannot undergo mammography.  I encouraged her to call if she had further questions or concerns about her healing. Otherwise, she will follow-up PRN in radiation oncology. Patient is pleased with this plan, and we will cancel her upcoming follow-up to reduce the risk of COVID-19 transmission.

## 2019-10-17 NOTE — Progress Notes (Signed)
  Patient Name: Kristen Gibson MRN: [id number] DOB: 21-Apr-1956 Referring Physician: Lurline Del (Profile Not Attached) Date of Service: 09/10/2019 [facility], [place]                                                        End Of Treatment Note  Diagnoses: C50.412-Malignant neoplasm of upper-outer quadrant of left female breast  Cancer Staging: Cancer Staging Malignant neoplasm of upper-outer quadrant of left breast in female, estrogen receptor positive ([place]) Staging form: Breast, AJCC 8th Edition - Clinical stage from 06/06/2019: Stage IA (cT1a, cN0, cM0, G1, ER+, PR+, HER2-) - Signed by Gardenia Phlegm, NP on 06/13/2019 - Pathologic stage from 07/12/2019: Stage IA (pT1b, pN0, cM0, G1, ER+, PR+, HER2-) - Signed by Gardenia Phlegm, NP on 07/25/2019   Intent: curative  Radiation Treatment Dates: 08/13/2019 through 09/10/2019 Site Technique Total Dose (Gy) Dose per Fx (Gy) Completed Fx Beam Energies  Breast, Left: Breast_Lt 3D 40.05/40.05 2.67 15/15 10X  Breast, Left: Breast_Lt_Bst specialPort 10/10 2 5/5 18E   Narrative: The patient tolerated radiation therapy relatively well.   Plan: The patient will follow-up with radiation oncology in 1 mo or as needed.  -----------------------------------  [name], MD

## 2019-10-17 NOTE — Telephone Encounter (Signed)
Pt called to report that Anastrozole was started on 10/04/2019.   Pt is experiencing hot flashes, mainly in the evening and while sleeping.  Otherwise patient, tolerating well.  Will review with MD for further recommendations.

## 2019-10-19 ENCOUNTER — Inpatient Hospital Stay
Admission: RE | Admit: 2019-10-19 | Discharge: 2019-10-19 | Disposition: A | Discharge status: Home / Self Care | Payer: Self-pay | Source: Ambulatory Visit | Attending: Radiation Oncology | Admitting: Radiation Oncology

## 2019-10-22 ENCOUNTER — Telehealth: Payer: Self-pay | Admitting: *Deleted

## 2019-10-22 NOTE — Telephone Encounter (Signed)
Call returned to pt per her VM - obtained pt's VM- message left to call again.

## 2019-10-22 NOTE — Telephone Encounter (Signed)
This RN spoke with pt- she states she is still having significant nausea with Anastrazole.  She started the gabapentin with benefit with hot flashes.  She states she is " taking 1/2 tablet daily to see if I can build up a tolerance "   She states if nausea continues she will call and " want to go on the other medication discussed "  Of note Kristen Gibson states she has tried taking the anastrazole at different times of the day with ongoing nausea.  Kristen Gibson will let this office know if she is able to tolerate above or would like to switch anti estrogens.

## 2019-10-26 NOTE — Progress Notes (Deleted)
  Patient Name: Kristen Gibson MRN: [id number] DOB: 12-11-1955 Referring Physician: Lurline Del (Profile Not Attached) Date of Service: 10/17/2019 [facility] [place], [place]                                                        End Of Treatment Note  Diagnoses: C50.412-Malignant neoplasm of upper-outer quadrant of left female breast  Cancer Staging: Cancer Staging Malignant neoplasm of upper-outer quadrant of left breast in female, estrogen receptor positive ([place]) Staging form: Breast, AJCC 8th Edition - Clinical stage from 06/06/2019: Stage IA (cT1a, cN0, cM0, G1, ER+, PR+, HER2-) - Signed by Gardenia Phlegm, NP on 06/13/2019 - Pathologic stage from 07/12/2019: Stage IA (pT1b, pN0, cM0, G1, ER+, PR+, HER2-) - Signed by Gardenia Phlegm, NP on 07/25/2019  Intent: Normal  Radiation Treatment Dates: 08/13/2019 through 09/10/2019  Site Technique Total Dose (Gy) Dose per Fx (Gy) Completed Fx Beam Energies  Breast, Left: Breast_Lt 3D 40.05/40.05 2.67 15/15 10X  Breast, Left: Breast_Lt_Bst specialPort 10/10 2 5/5 18E   Narrative: The patient tolerated radiation therapy relatively well.   Plan: The patient will follow-up with radiation oncology in 84mo or as needed.  -----------------------------------  [name] MD

## 2019-10-31 ENCOUNTER — Other Ambulatory Visit: Payer: Self-pay | Admitting: Oncology

## 2020-01-14 ENCOUNTER — Other Ambulatory Visit: Payer: PRIVATE HEALTH INSURANCE

## 2020-01-14 ENCOUNTER — Ambulatory Visit: Payer: PRIVATE HEALTH INSURANCE | Admitting: Oncology

## 2020-01-15 ENCOUNTER — Other Ambulatory Visit: Payer: Self-pay

## 2020-01-15 ENCOUNTER — Ambulatory Visit
Admission: RE | Admit: 2020-01-15 | Discharge: 2020-01-15 | Disposition: A | Discharge status: Home / Self Care | Payer: PRIVATE HEALTH INSURANCE | Source: Ambulatory Visit | Attending: Oncology | Admitting: Oncology

## 2020-01-15 DIAGNOSIS — C50811 Malignant neoplasm of overlapping sites of right female breast: Secondary | ICD-10-CM

## 2020-01-15 DIAGNOSIS — Z170 Estrogen receptor positive status [ER+]: Secondary | ICD-10-CM

## 2020-01-23 ENCOUNTER — Other Ambulatory Visit: Payer: Self-pay

## 2020-01-23 ENCOUNTER — Inpatient Hospital Stay (HOSPITAL_BASED_OUTPATIENT_CLINIC_OR_DEPARTMENT_OTHER): Payer: PRIVATE HEALTH INSURANCE | Admitting: Oncology

## 2020-01-23 ENCOUNTER — Inpatient Hospital Stay: Payer: PRIVATE HEALTH INSURANCE | Attending: Oncology

## 2020-01-23 VITALS — BP 128/94 | HR 59 | Temp 98.2°F | Resp 18 | Ht 64.0 in | Wt 222.2 lb

## 2020-01-23 DIAGNOSIS — Z79811 Long term (current) use of aromatase inhibitors: Secondary | ICD-10-CM | POA: Diagnosis not present

## 2020-01-23 DIAGNOSIS — Z87891 Personal history of nicotine dependence: Secondary | ICD-10-CM | POA: Insufficient documentation

## 2020-01-23 DIAGNOSIS — Z8051 Family history of malignant neoplasm of kidney: Secondary | ICD-10-CM | POA: Diagnosis not present

## 2020-01-23 DIAGNOSIS — C50412 Malignant neoplasm of upper-outer quadrant of left female breast: Secondary | ICD-10-CM

## 2020-01-23 DIAGNOSIS — E119 Type 2 diabetes mellitus without complications: Secondary | ICD-10-CM | POA: Insufficient documentation

## 2020-01-23 DIAGNOSIS — C50811 Malignant neoplasm of overlapping sites of right female breast: Secondary | ICD-10-CM | POA: Diagnosis not present

## 2020-01-23 DIAGNOSIS — Z923 Personal history of irradiation: Secondary | ICD-10-CM | POA: Insufficient documentation

## 2020-01-23 DIAGNOSIS — Z79899 Other long term (current) drug therapy: Secondary | ICD-10-CM | POA: Diagnosis not present

## 2020-01-23 DIAGNOSIS — C50912 Malignant neoplasm of unspecified site of left female breast: Secondary | ICD-10-CM | POA: Diagnosis present

## 2020-01-23 DIAGNOSIS — E039 Hypothyroidism, unspecified: Secondary | ICD-10-CM | POA: Diagnosis not present

## 2020-01-23 DIAGNOSIS — F419 Anxiety disorder, unspecified: Secondary | ICD-10-CM | POA: Diagnosis not present

## 2020-01-23 DIAGNOSIS — Z170 Estrogen receptor positive status [ER+]: Secondary | ICD-10-CM | POA: Diagnosis not present

## 2020-01-23 DIAGNOSIS — Z800 Family history of malignant neoplasm of digestive organs: Secondary | ICD-10-CM | POA: Diagnosis not present

## 2020-01-23 DIAGNOSIS — I2510 Atherosclerotic heart disease of native coronary artery without angina pectoris: Secondary | ICD-10-CM | POA: Diagnosis not present

## 2020-01-23 LAB — CBC WITH DIFFERENTIAL/PLATELET
Abs Immature Granulocytes: 0.04 10*3/uL (ref 0.00–0.07)
Basophils Absolute: 0.1 10*3/uL (ref 0.0–0.1)
Basophils Relative: 0 %
Eosinophils Absolute: 0.3 10*3/uL (ref 0.0–0.5)
Eosinophils Relative: 3 %
HCT: 37.4 % (ref 36.0–46.0)
Hemoglobin: 12.8 g/dL (ref 12.0–15.0)
Immature Granulocytes: 0 %
Lymphocytes Relative: 23 %
Lymphs Abs: 2.6 10*3/uL (ref 0.7–4.0)
MCH: 31.3 pg (ref 26.0–34.0)
MCHC: 34.2 g/dL (ref 30.0–36.0)
MCV: 91.4 fL (ref 80.0–100.0)
Monocytes Absolute: 0.9 10*3/uL (ref 0.1–1.0)
Monocytes Relative: 8 %
Neutro Abs: 7.6 10*3/uL (ref 1.7–7.7)
Neutrophils Relative %: 66 %
Platelets: 232 10*3/uL (ref 150–400)
RBC: 4.09 MIL/uL (ref 3.87–5.11)
RDW: 12.5 % (ref 11.5–15.5)
WBC: 11.4 10*3/uL — ABNORMAL HIGH (ref 4.0–10.5)
nRBC: 0 % (ref 0.0–0.2)

## 2020-01-23 LAB — COMPREHENSIVE METABOLIC PANEL
ALT: 15 U/L (ref 0–44)
AST: 13 U/L — ABNORMAL LOW (ref 15–41)
Albumin: 3.5 g/dL (ref 3.5–5.0)
Alkaline Phosphatase: 106 U/L (ref 38–126)
Anion gap: 9 (ref 5–15)
BUN: 16 mg/dL (ref 8–23)
CO2: 22 mmol/L (ref 22–32)
Calcium: 9.3 mg/dL (ref 8.9–10.3)
Chloride: 110 mmol/L (ref 98–111)
Creatinine, Ser: 0.77 mg/dL (ref 0.44–1.00)
GFR calc Af Amer: >60 mL/min (ref >60)
GFR calc non Af Amer: >60 mL/min (ref >60)
Glucose, Bld: 110 mg/dL — ABNORMAL HIGH (ref 70–99)
Potassium: 3.6 mmol/L (ref 3.5–5.1)
Sodium: 141 mmol/L (ref 135–145)
Total Bilirubin: 0.3 mg/dL (ref 0.3–1.2)
Total Protein: 6.7 g/dL (ref 6.5–8.1)

## 2020-01-23 MED ORDER — ANASTROZOLE 1 MG PO TABS: 0.5000 mg | ORAL_TABLET | Freq: Every day | ORAL | 4 refills | Status: DC

## 2020-01-23 NOTE — Progress Notes (Signed)
[place]  Telephone:([phone]) [phone] Fax:([phone]) [phone]     ID: Kristen Gibson DOB: Aug 23, 1955  MR#: [id number]  FUX#:[id number]  Patient Care Team: Shon Baton, MD as PCP - General (Internal Medicine) Coralie Keens, MD as Consulting Physician (General Surgery) Tabby Beaston, Virgie Dad, MD as Consulting Physician (Oncology) [name], MD as Consulting Physician (Radiation Oncology) Mauro Kaufmann, RN as Oncology Nurse Navigator Rockwell Germany, RN as Oncology Nurse Navigator Christophe Louis, MD as Consulting Physician (Obstetrics and Gynecology) Chauncey Cruel, MD OTHER MD:  CHIEF COMPLAINT:  Bilateral estrogen receptor positive breast cancers  CURRENT TREATMENT: anastrozole   INTERVAL HISTORY: Kristen Gibson returns today for follow up of her bilateral estrogen receptor positive breast cancers.   She completed radiation therapy on 09/10/2019.  She began anastrozole on 10/04/2019.  She felt absolutely horrible on this medication in addition to hot flashes and we decided to try half a pill.  She got herself a pill cutter from [employer] and she now feels much better.  She has perhaps one hot flash daily.  She does have some vaginal dryness issues which are not any different than before.  But she does not have that "terrible feeling" that she had previously.  Since her last visit, she underwent bilateral diagnostic mammography with tomography at [facility] on 01/15/2020 showing: breast density category B; no evidence of malignancy in either breast.    REVIEW OF SYSTEMS: Kristen Gibson of course is very concerned about her younger brother situation and we are following that with her.  She works out in the yard and recently had a Midwife with a tree that resulted in some bruises.  She tells me her last bone density was almost a decade ago.  A detailed review of systems today was otherwise stable   HISTORY OF CURRENT ILLNESS: From the original intake note:  Kristen Gibson has  a history of right breast cancer detailed below.  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  She was prescribed tamoxifen but never took it regularly or perhaps at all and after the June 2014 visit here she was lost to follow-up.   More recently she had routine screening mammography on 05/25/2019 showing a possible abnormality in the left breast. She underwent left diagnostic mammography with tomography and left breast ultrasonography at [facility] on 06/06/2019 showing: breast density category B; indeterminate 4 mm mass in the left breast at 2 o'clock; no abnormal left axillary lymph nodes.  Accordingly on 06/06/2019 she proceeded to biopsy of the left breast area in question. The pathology from this procedure ([id number]) showed: invasive ductal carcinoma, grade 1, with ductal carcinoma in situ. Prognostic indicators significant for: estrogen receptor, 95% positive and progesterone receptor, 95% positive, both with strong staining intensity. Proliferation marker Ki67 at 10%. HER2 equivocal by immunohistochemistry (2+), but negative by fluorescent in situ hybridization with a signals ratio 1.24 and number per cell 2.05.  The patient's subsequent history is as detailed below.   History of right sided breast cancer: The patient had been monitored with short interval mammography over two years for unexplained bruising of the right breast. In October 2012  a small spiculated mass was noted in the outer portion of the right breast posteriorly.  Ultrasound confirmed a small irregularly-marginated lesion at the 9 o'clock position 9 cm from the right nipple measuring 4 x 4 x 5 mm.  The Right axilla was unremarkable.  Ultrasound-guided core biopsy on 04/12/2011 revealed a low-grade ductal carcinoma that was estrogen receptor positive at 60%, progesterone receptor positive at 8%, Ki-67 was 5% and HER2/neu negative with a  ratio by CISH of 1.29.  Subseuently bilateral breast MRI showed a 9 x 8 x 6 mm mass deep in the upper outer quadrant of the right breast.  No other abnormalities were noted. The patient underwent definitive Right lumpectomy and sentinel lymph node sampling 05/13/2011 with results and subsequent history as noted below.   PAST MEDICAL HISTORY: Past Medical History:  Diagnosis Date  . Allergy   . Anxiety    no medications for anxiety  . Arthritis   . Breast cancer ([place])    2012  . Cancer ([place]) 05/2011   right breast  . GERD (gastroesophageal reflux disease)   . Hypothyroid   . Personal history of radiation therapy    2012 right breast  . Personal history of radiation therapy    2021 left breast  . Psoriasis   . S/P radiation therapy 06/17/2011 - 08/04/2011   Right Breast - 45 Gy/25 fractions whole breast, and 16 Gy/8 fractions for boost    PAST SURGICAL HISTORY: Past Surgical History:  Procedure Laterality Date  . BREAST LUMPECTOMY Right    2012  . BREAST LUMPECTOMY Left    2021  . BREAST LUMPECTOMY WITH RADIOACTIVE SEED AND SENTINEL LYMPH NODE BIOPSY Left 07/12/2019   Procedure: LEFT BREAST LUMPECTOMY WITH RADIOACTIVE SEED AND SENTINEL LYMPH NODE BIOPSY;  Surgeon: Coralie Keens, MD;  Location: [facility];  Service: General;  Laterality: Left;  . BREAST SURGERY  05/13/11   Rt Breast Lumpectomy with sentinel node biospy, invasive ductal ca, 0/2 nodes   . TONSILLECTOMY    . TUBAL LIGATION     At age 25    FAMILY HISTORY: Family History  Problem Relation Age of Onset  . Cancer Mother        kidney  . Kidney cancer Mother   . Cancer Maternal Grandmother        colon  . Colon cancer Maternal Grandmother   . Cancer Paternal Grandmother        colon  . Colon cancer Paternal Grandmother   . Diabetes Neg Hx   . Rectal cancer Neg Hx   . Stomach cancer Neg Hx    The patient's father died with significant dementia at age 18..  Mother died at the age of 74   from kidney cancer.The patient had no sisters. One brother is alive and well as of December 2020.  The patient's maternal grandmother had colon cancer, diagnosed at unknown age, and paternal grandmother had colon cancer diagnosed at age 10. There is no history of breast or ovarian cancer in the family to her knowledge.   GYNECOLOGIC HISTORY:  Patient's last menstrual period was 10/08/2010. Menarche: 64 years old [place] P 0 LMP 10/2010 Contraceptive briefly HRT no  Hysterectomy? no BSO? 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  She lives alone, her dog having recently died.  The patient's brother worked in a warehouse, third shift, but is currently undergoing intensive treatment for his medical issues.     ADVANCED DIRECTIVES: Patient's brother [name] is her healthcare power of attorney.  He can be reached at [phone].   HEALTH MAINTENANCE: Social History   Tobacco Use  . Smoking status: Former Smoker    Packs/day: 0.30  Years: 30.00    Pack years: 9.00    Types: Cigarettes    Quit date: 05/09/2009    Years since quitting: 10.7  . Smokeless tobacco: Never Used  Vaping Use  . Vaping Use: Never used  Substance Use Topics  . Alcohol use: No  . Drug use: No     Colonoscopy: 02/2018 (Dr. Henrene Pastor), repeated every 3 years  PAP: 08/2017, negative  Bone density:    No Known Allergies  Current Outpatient Medications  Medication Sig Dispense Refill  . ALPRAZolam (XANAX) 0.5 MG tablet Take 0.25 mg by mouth at bedtime as needed.    Marland Kitchen anastrozole (ARIMIDEX) 1 MG tablet Take 1 tablet (1 mg total) by mouth daily. 90 tablet 4  . celecoxib (CELEBREX) 200 MG capsule Take 200 mg by mouth 2 (two) times daily.    . Cholecalciferol (VITAMIN D) 1000 UNITS capsule Take 1,000 Units by mouth daily.      Marland Kitchen FINACEA 15 % cream     . gabapentin (NEURONTIN) 300 MG capsule Take 1 capsule (300 mg total) by mouth at bedtime. 30  capsule 2  . levothyroxine (SYNTHROID, LEVOTHROID) 137 MCG tablet TAKE 1 TABLET BY MOUTH EVERY DAY 90 tablet 0  . minocycline (MINOCIN,DYNACIN) 100 MG capsule Take 100 mg by mouth daily as needed. For rosacea     . omeprazole (PRILOSEC) 20 MG capsule Take 20 mg by mouth daily.    Marland Kitchen OVER THE COUNTER MEDICATION Vitamin B 12 5000 mcg two tablets daily.    Marland Kitchen oxyCODONE (OXY IR/ROXICODONE) 5 MG immediate release tablet Take 1 tablet (5 mg total) by mouth every 6 (six) hours as needed for moderate pain or severe pain. (Patient not taking: Reported on 08/03/2019) 25 tablet 0  . tamoxifen (NOLVADEX) 20 MG tablet Take 1 tablet (20 mg total) by mouth daily. (Patient not taking: Reported on 02/24/2018) 90 tablet 12   No current facility-administered medications for this visit.    OBJECTIVE: white woman who appears younger than stated age  68:   01/23/20 1526  BP: (!) 128/94  Pulse: (!) 59  Resp: 18  Temp: 98.2 F (36.8 C)  SpO2: 98%     Body mass index is 38.14 kg/m.   Wt Readings from Last 3 Encounters:  01/23/20 222 lb 3.2 oz (100.8 kg)  09/05/19 220 lb 3.2 oz (99.9 kg)  08/03/19 220 lb (99.8 kg)      ECOG FS:1 - Symptomatic but completely ambulatory  Sclerae unicteric, EOMs intact Wearing a mask No cervical or supraclavicular adenopathy Lungs no rales or rhonchi Heart regular rate and rhythm Abd soft, nontender, positive bowel sounds MSK no focal spinal tenderness, no upper extremity lymphedema Neuro: nonfocal, well oriented, appropriate affect Breasts: The right breast is benign.  The left breast undergone lumpectomy and radiation.  There is no evidence of disease recurrence.  There is a minimal blush.  Both axillae are benign.   LAB RESULTS:  CMP     Component Value Date/Time   NA 141 01/23/2020 1514   NA 142 12/12/2012 0847   K 3.6 01/23/2020 1514   K 4.1 12/12/2012 0847   CL 110 01/23/2020 1514   CL 106 12/12/2012 0847   CO2 22 01/23/2020 1514   CO2 26 12/12/2012  0847   GLUCOSE 110 (H) 01/23/2020 1514   GLUCOSE 128 (H) 12/12/2012 0847   BUN 16 01/23/2020 1514   BUN 11.2 12/12/2012 0847   CREATININE 0.77 01/23/2020 1514   CREATININE 0.78 06/19/2019  1524   CREATININE 0.9 12/12/2012 0847   CALCIUM 9.3 01/23/2020 1514   CALCIUM 8.8 12/12/2012 0847   PROT 6.7 01/23/2020 1514   PROT 6.7 12/12/2012 0847   ALBUMIN 3.5 01/23/2020 1514   ALBUMIN 3.3 (L) 12/12/2012 0847   AST 13 (L) 01/23/2020 1514   AST 14 (L) 06/19/2019 1524   AST 12 12/12/2012 0847   ALT 15 01/23/2020 1514   ALT 13 06/19/2019 1524   ALT 13 12/12/2012 0847   ALKPHOS 106 01/23/2020 1514   ALKPHOS 83 12/12/2012 0847   BILITOT 0.3 01/23/2020 1514   BILITOT 0.3 06/19/2019 1524   BILITOT 0.46 12/12/2012 0847   GFRNONAA >60 01/23/2020 1514   GFRNONAA >60 06/19/2019 1524   GFRAA >60 01/23/2020 1514   GFRAA >60 06/19/2019 1524    No results found for: TOTALPROTELP, ALBUMINELP, A1GS, A2GS, BETS, BETA2SER, GAMS, MSPIKE, SPEI  No results found for: KPAFRELGTCHN, LAMBDASER, KAPLAMBRATIO  Lab Results  Component Value Date   WBC 11.4 (H) 01/23/2020   NEUTROABS 7.6 01/23/2020   HGB 12.8 01/23/2020   HCT 37.4 01/23/2020   MCV 91.4 01/23/2020   PLT 232 01/23/2020    Lab Results  Component Value Date   LABCA2 20 04/21/2011    No components found for: [id number]  No results for input(s): INR in the last 168 hours.  Lab Results  Component Value Date   LABCA2 20 04/21/2011    No results found for: DSK876  No results found for: OTL572  No results found for: IOM355  No results found for: CA2729  No components found for: HGQUANT  No results found for: CEA1 / No results found for: CEA1   No results found for: AFPTUMOR  No results found for: CHROMOGRNA  No results found for: HGBA, HGBA2QUANT, HGBFQUANT, HGBSQUAN (Hemoglobinopathy evaluation)   No results found for: LDH  No results found for: IRON, TIBC, IRONPCTSAT (Iron and TIBC)  No results found for:  FERRITIN  Urinalysis No results found for: COLORURINE, APPEARANCEUR, LABSPEC, PHURINE, GLUCOSEU, HGBUR, BILIRUBINUR, KETONESUR, PROTEINUR, UROBILINOGEN, NITRITE, LEUKOCYTESUR   STUDIES: MM DIAG BREAST TOMO BILATERAL  Result Date: 01/15/2020 CLINICAL DATA:  64 year old female with history of left breast cancer post lumpectomy 07/12/2019 followed by radiation. Patient also has a history of prior right breast cancer post lumpectomy 2012. Patient presents today for new baseline exam. EXAM: DIGITAL DIAGNOSTIC BILATERAL MAMMOGRAM WITH TOMO AND CAD COMPARISON:  Previous exams. ACR Breast Density Category b: There are scattered areas of fibroglandular density. FINDINGS: No suspicious masses or calcifications are seen in either breast. New lumpectomy and radiation changes are present in the left breast. Spot compression magnification CC view of the lumpectomy site in the upper-outer left breast was performed. There is no mammographic evidence of locally recurrent malignancy. Mammographic images were processed with CAD. IMPRESSION: New lumpectomy site left breast. No mammographic evidence of malignancy in either breast. RECOMMENDATION: Diagnostic mammogram is suggested in 1 year. (Code:[id number]-B-01Y) I have discussed the findings and recommendations with the patient. If applicable, a reminder letter will be sent to the patient regarding the next appointment. BI-RADS CATEGORY  2: Benign. Electronically Signed   By: Everlean Alstrom M.D.   On: 01/15/2020 08:14    ELIGIBLE FOR AVAILABLE RESEARCH PROTOCOL: no  ASSESSMENT: 64 y.o. [place] woman with Adderall breast cancer history as follows:   (1) status post right lumpectomy and sentinel lymph node biopsy 05/13/2011 for a T1c N0, stage IA invasive ductal carcinoma, grade 1, estrogen receptor 69% and progesterone receptor  84% positive, with an MIB-1 of 6%, and no HER-2 amplification.   (a) completed adjuvant radiation January 2013   (b) tamoxifen started January  2013 and taken irregularly for up to 3 years per patient recollection  (2) status post left breast upper outer quadrant biopsy 06/06/2019 for a clinical T1a N0, stage IA's of ductal carcinoma, grade 1, estrogen and progesterone receptor positive, HER-2 nonamplified, with an MIB-1 of 10%  (3) genetics testing pending  (4) status post left lumpectomy and sentinel lymph node sampling 07/12/2019 for a pT1b pN0, stage IA invasive ductal carcinoma, grade 1, with negative margins  (a) the single sentinel lymph node removed was negative  (5) adjuvant radiation 08/13/2019 through 09/10/2019 Site Technique Total Dose (Gy) Dose per Fx (Gy) Completed Fx Beam Energies  Breast, Left: Breast_Lt 3D 40.05/40.05 2.67 15/15 10X  Breast, Left: Breast_Lt_Bst specialPort 10/10 2 5/5 18E   (6) started anastrozole 10/04/2019  (a) dose cut to 1/2 tablet daily 10/22/2019, and poor tolerance  (7) chest CT scan 08/09/2019 showed a 0.6 cm right middle lobe pulmonary nodule stable as compared to August 2019, 59-monthfollow-up CT without contrast suggested   PLAN: DSharenais now half a year out from definitive surgery for her breast cancer with no evidence of disease recurrence this is very favorable  She is tolerating anastrozole much better at half a dose.  We are going to continue that indefinitely.  At some point perhaps next year we can consider trying 1 mg daily again.  Aside from other issues DSadaeis under a great deal of stress because of her brother's situation.   We are hoping for a good outcome.  Total encounter time 25 minutes.*Chauncey Cruel MD   01/23/2020 3:51 PM [facility] [place] [id number]. [phone]   Fax. [phone]  This document serves as a record of services personally performed by GLurline Del MD. It was created on his behalf by KWilburn Mylar a trained medical scribe. The creation of this  record is based on the scribe's personal observations and the provider's statements to them.   I, GLurline DelMD, have reviewed the above documentation for accuracy and completeness, and I agree with the above.   *Total Encounter Time as defined by the Centers for Medicare and Medicaid Services includes, in addition to the face-to-face time of a patient visit (documented in the note above) non-face-to-face time: obtaining and reviewing outside history, ordering and reviewing medications, tests or procedures, care coordination (communications with other health care professionals or caregivers) and documentation in the medical record.

## 2020-10-02 ENCOUNTER — Other Ambulatory Visit: Payer: Self-pay | Admitting: Oncology

## 2020-10-09 ENCOUNTER — Other Ambulatory Visit: Payer: Self-pay | Admitting: Oncology

## 2020-10-09 DIAGNOSIS — Z9889 Other specified postprocedural states: Secondary | ICD-10-CM

## 2021-01-12 ENCOUNTER — Other Ambulatory Visit: Payer: Self-pay | Admitting: Oncology

## 2021-01-12 DIAGNOSIS — Z853 Personal history of malignant neoplasm of breast: Secondary | ICD-10-CM

## 2021-01-15 ENCOUNTER — Other Ambulatory Visit: Payer: Self-pay

## 2021-01-15 ENCOUNTER — Ambulatory Visit
Admission: RE | Admit: 2021-01-15 | Discharge: 2021-01-15 | Disposition: A | Discharge status: Home / Self Care | Payer: PRIVATE HEALTH INSURANCE | Source: Ambulatory Visit | Attending: Oncology | Admitting: Oncology

## 2021-01-15 DIAGNOSIS — Z853 Personal history of malignant neoplasm of breast: Secondary | ICD-10-CM

## 2021-01-28 NOTE — Progress Notes (Signed)
[place]  [phone] [phone]     ID: Kristen Gibson DOB: 11/06/1955  MR#: [id number]  IRJ#:[id number]  Patient Care Team: Shon Baton, MD as PCP - General (Internal Medicine) Coralie Keens, MD as Consulting Physician (General Surgery) Keelyn Fjelstad, Virgie Dad, MD as Consulting Physician (Oncology) [name], MD as Consulting Physician (Radiation Oncology) Mauro Kaufmann, RN as Oncology Nurse Navigator Rockwell Germany, RN as Oncology Nurse Navigator Christophe Louis, MD as Consulting Physician (Obstetrics and Gynecology) Aurea Graff OTHER MD:  CHIEF COMPLAINT:  Bilateral estrogen receptor positive breast cancers  CURRENT TREATMENT: anastrozole   INTERVAL HISTORY: Kristen Gibson was scheduled today for follow up of her bilateral estrogen receptor positive breast cancers, however she did not show.     Since her last visit, she underwent bilateral diagnostic mammography with tomography at [facility] on 01/15/2021 showing: breast density category B; no evidence of malignancy in either breast.    REVIEW OF SYSTEMS: Kristen Gibson    COVID 19 VACCINATION STATUS:    HISTORY OF CURRENT ILLNESS: From the original intake note:  Kristen Gibson has a history of right breast cancer detailed below.  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  She was prescribed tamoxifen but never took it regularly or perhaps at all and after the June 2014 visit here she was lost to follow-up.   More recently she had routine screening mammography on 05/25/2019 showing a possible abnormality in the left breast. She underwent left diagnostic mammography with tomography and left breast ultrasonography at [facility] on 06/06/2019 showing: breast density category B; indeterminate 4 mm mass in the left breast at 2 o'clock; no abnormal left axillary lymph nodes.  Accordingly  on 06/06/2019 she proceeded to biopsy of the left breast area in question. The pathology from this procedure ([id number]) showed: invasive ductal carcinoma, grade 1, with ductal carcinoma in situ. Prognostic indicators significant for: estrogen receptor, 95% positive and progesterone receptor, 95% positive, both with strong staining intensity. Proliferation marker Ki67 at 10%. HER2 equivocal by immunohistochemistry (2+), but negative by fluorescent in situ hybridization with a signals ratio 1.24 and number per cell 2.05.  The patient's subsequent history is as detailed below.   History of right sided breast cancer: The patient had been monitored with short interval mammography over two years for unexplained bruising of the right breast. In October 2012  a small spiculated mass was noted in the outer portion of the right breast posteriorly.  Ultrasound confirmed a small irregularly-marginated lesion at the 9 o'clock position 9 cm from the right nipple measuring 4 x 4 x 5 mm.  The Right axilla was unremarkable. Ultrasound-guided core biopsy on 04/12/2011 revealed a low-grade ductal carcinoma that was estrogen receptor positive at 60%, progesterone receptor positive at 8%, Ki-67 was 5% and HER2/neu negative with a ratio by CISH of 1.29.  Subseuently bilateral breast MRI showed a 9 x 8 x 6 mm mass deep in the upper outer quadrant of the right breast.  No other abnormalities were noted. The patient underwent definitive Right lumpectomy and sentinel lymph node sampling 05/13/2011 with results and subsequent history as noted below.   PAST MEDICAL HISTORY: Past Medical History:  Diagnosis Date   Allergy    Anxiety    no medications for anxiety   Arthritis    Breast cancer ([place])    2012   Cancer ([place]) 05/2011   right  breast   GERD (gastroesophageal reflux disease)    Hypothyroid    Personal history of radiation therapy    2012 right breast   Personal history of radiation therapy    2021 left breast    Psoriasis    S/P radiation therapy 06/17/2011 - 08/04/2011   Right Breast - 45 Gy/25 fractions whole breast, and 16 Gy/8 fractions for boost    PAST SURGICAL HISTORY: Past Surgical History:  Procedure Laterality Date   BREAST LUMPECTOMY Right    2012   BREAST LUMPECTOMY Left    2021   BREAST LUMPECTOMY WITH RADIOACTIVE SEED AND SENTINEL LYMPH NODE BIOPSY Left 07/12/2019   Procedure: LEFT BREAST LUMPECTOMY WITH RADIOACTIVE SEED AND SENTINEL LYMPH NODE BIOPSY;  Surgeon: Coralie Keens, MD;  Location: [facility];  Service: General;  Laterality: Left;   BREAST SURGERY  05/13/11   Rt Breast Lumpectomy with sentinel node biospy, invasive ductal ca, 0/2 nodes    TONSILLECTOMY     TUBAL LIGATION     At age 85    FAMILY HISTORY: Family History  Problem Relation Age of Onset   Cancer Mother        kidney   Kidney cancer Mother    Cancer Maternal Grandmother        colon   Colon cancer Maternal Grandmother    Cancer Paternal Grandmother        colon   Colon cancer Paternal Grandmother    Diabetes Neg Hx    Rectal cancer Neg Hx    Stomach cancer Neg Hx   The patient's father died with significant dementia at age 60. Mother died at the age of 51  from kidney cancer.The patient had no sisters. One brother is alive and well as of December 2020.  The patient's maternal grandmother had colon cancer, diagnosed at unknown age, and paternal grandmother had colon cancer diagnosed at age 57. There is no history of breast or ovarian cancer in the family to her knowledge.   GYNECOLOGIC HISTORY:  Patient's last menstrual period was 10/08/2010. Menarche: 65 years old [place] P 0 LMP 10/2010 Contraceptive briefly HRT no  Hysterectomy? no BSO? 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  She lives alone, her dog having recently died.  The patient's brother worked in a warehouse, third shift, but is currently  undergoing intensive treatment for his medical issues.     ADVANCED DIRECTIVES: Patient's brother Kristen Gibson is her healthcare power of attorney.  He can be reached at [phone].   HEALTH MAINTENANCE: Social History   Tobacco Use   Smoking status: Former    Packs/day: 0.30    Years: 30.00    Pack years: 9.00    Types: Cigarettes    Quit date: 05/09/2009    Years since quitting: 11.7   Smokeless tobacco: Never  Vaping Use   Vaping Use: Never used  Substance Use Topics   Alcohol use: No   Drug use: No     Colonoscopy: 02/2018 (Dr. Henrene Pastor), repeated every 3 years  PAP: 08/2017, negative  Bone density:    No Known Allergies  Current Outpatient Medications  Medication Sig Dispense Refill   anastrozole (ARIMIDEX) 1 MG tablet TAKE 1 TABLET BY MOUTH EVERY DAY (TO START 10-04-19) 30 tablet 14   celecoxib (CELEBREX) 200 MG capsule Take 200 mg by mouth 2 (two) times daily.     Cholecalciferol (VITAMIN D) 1000 UNITS  capsule Take 1,000 Units by mouth daily.       FINACEA 15 % cream      gabapentin (NEURONTIN) 300 MG capsule Take 1 capsule (300 mg total) by mouth at bedtime. 30 capsule 2   levothyroxine (SYNTHROID, LEVOTHROID) 137 MCG tablet TAKE 1 TABLET BY MOUTH EVERY DAY 90 tablet 0   omeprazole (PRILOSEC) 20 MG capsule Take 20 mg by mouth daily.     OVER THE COUNTER MEDICATION Vitamin B 12 5000 mcg two tablets daily.     No current facility-administered medications for this visit.    OBJECTIVE: white woman who appears younger than stated age  There were no vitals filed for this visit.    There is no height or weight on file to calculate BMI.   Wt Readings from Last 3 Encounters:  01/23/20 222 lb 3.2 oz (100.8 kg)  09/05/19 220 lb 3.2 oz (99.9 kg)  08/03/19 220 lb (99.8 kg)     ECOG FS:1 - Symptomatic but completely ambulatory   LAB RESULTS:  CMP     Component Value Date/Time   NA 141 01/23/2020 1514   NA 142 12/12/2012 0847   K 3.6 01/23/2020 1514   K 4.1 12/12/2012  0847   CL 110 01/23/2020 1514   CL 106 12/12/2012 0847   CO2 22 01/23/2020 1514   CO2 26 12/12/2012 0847   GLUCOSE 110 (H) 01/23/2020 1514   GLUCOSE 128 (H) 12/12/2012 0847   BUN 16 01/23/2020 1514   BUN 11.2 12/12/2012 0847   CREATININE 0.77 01/23/2020 1514   CREATININE 0.78 06/19/2019 1524   CREATININE 0.9 12/12/2012 0847   CALCIUM 9.3 01/23/2020 1514   CALCIUM 8.8 12/12/2012 0847   PROT 6.7 01/23/2020 1514   PROT 6.7 12/12/2012 0847   ALBUMIN 3.5 01/23/2020 1514   ALBUMIN 3.3 (L) 12/12/2012 0847   AST 13 (L) 01/23/2020 1514   AST 14 (L) 06/19/2019 1524   AST 12 12/12/2012 0847   ALT 15 01/23/2020 1514   ALT 13 06/19/2019 1524   ALT 13 12/12/2012 0847   ALKPHOS 106 01/23/2020 1514   ALKPHOS 83 12/12/2012 0847   BILITOT 0.3 01/23/2020 1514   BILITOT 0.3 06/19/2019 1524   BILITOT 0.46 12/12/2012 0847   GFRNONAA >60 01/23/2020 1514   GFRNONAA >60 06/19/2019 1524   GFRAA >60 01/23/2020 1514   GFRAA >60 06/19/2019 1524    No results found for: TOTALPROTELP, ALBUMINELP, A1GS, A2GS, BETS, BETA2SER, GAMS, MSPIKE, SPEI  No results found for: KPAFRELGTCHN, LAMBDASER, KAPLAMBRATIO  Lab Results  Component Value Date   WBC 11.4 (H) 01/23/2020   NEUTROABS 7.6 01/23/2020   HGB 12.8 01/23/2020   HCT 37.4 01/23/2020   MCV 91.4 01/23/2020   PLT 232 01/23/2020    Lab Results  Component Value Date   LABCA2 20 04/21/2011    No components found for: [id number]  No results for input(s): INR in the last 168 hours.  Lab Results  Component Value Date   LABCA2 20 04/21/2011    No results found for: SHU837  No results found for: GBM211  No results found for: DBZ208  No results found for: CA2729  No components found for: HGQUANT  No results found for: CEA1 / No results found for: CEA1   No results found for: AFPTUMOR  No results found for: CHROMOGRNA  No results found for: HGBA, HGBA2QUANT, HGBFQUANT, HGBSQUAN (Hemoglobinopathy evaluation)   No results found for:  LDH  No results found for: IRON, TIBC, IRONPCTSAT (  Iron and TIBC)  No results found for: FERRITIN  Urinalysis No results found for: COLORURINE, APPEARANCEUR, LABSPEC, PHURINE, GLUCOSEU, HGBUR, BILIRUBINUR, KETONESUR, PROTEINUR, UROBILINOGEN, NITRITE, LEUKOCYTESUR   STUDIES: MM DIAG BREAST TOMO BILATERAL  Result Date: 01/15/2021 CLINICAL DATA:  Left lumpectomy.  Annual mammography. EXAM: DIGITAL DIAGNOSTIC BILATERAL MAMMOGRAM WITH TOMOSYNTHESIS AND CAD TECHNIQUE: Bilateral digital diagnostic mammography and breast tomosynthesis was performed. The images were evaluated with computer-aided detection. COMPARISON:  Previous exam(s). ACR Breast Density Category b: There are scattered areas of fibroglandular density. FINDINGS: The left lumpectomy site is stable. No suspicious findings in either breast. IMPRESSION: No mammographic evidence of malignancy. RECOMMENDATION: Annual diagnostic mammography. I have discussed the findings and recommendations with the patient. If applicable, a reminder letter will be sent to the patient regarding the next appointment. BI-RADS CATEGORY  2: Benign. Electronically Signed   By: Dorise Bullion III M.D   On: 01/15/2021 08:10    ELIGIBLE FOR AVAILABLE RESEARCH PROTOCOL: no  ASSESSMENT: 65 y.o. [place] woman with Adderall breast cancer history as follows:   (1) status post right lumpectomy and sentinel lymph node biopsy 05/13/2011 for a T1c N0, stage IA invasive ductal carcinoma, grade 1, estrogen receptor 69% and progesterone receptor 84% positive, with an MIB-1 of 6%, and no HER-2 amplification.   (a) completed adjuvant radiation January 2013   (b) tamoxifen started January 2013 and taken irregularly for up to 3 years per patient recollection  (2) status post left breast upper outer quadrant biopsy 06/06/2019 for a clinical T1a N0, stage IA's of ductal carcinoma, grade 1, estrogen and progesterone receptor positive, HER-2 nonamplified, with an MIB-1 of  10%  (3) genetics testing pending  (4) status post left lumpectomy and sentinel lymph node sampling 07/12/2019 for a pT1b pN0, stage IA invasive ductal carcinoma, grade 1, with negative margins  (a) the single sentinel lymph node removed was negative  (5) adjuvant radiation 08/13/2019 through 09/10/2019 Site Technique Total Dose (Gy) Dose per Fx (Gy) Completed Fx Beam Energies  Breast, Left: Breast_Lt 3D 40.05/40.05 2.67 15/15 10X  Breast, Left: Breast_Lt_Bst specialPort 10/10 2 5/5 18E   (6) started anastrozole 10/04/2019  (a) dose cut to 1/2 tablet daily 10/22/2019, and poor tolerance  (7) chest CT scan 08/09/2019 showed a 0.6 cm right middle lobe pulmonary nodule stable as compared to August 2019, 64-monthfollow-up CT without contrast suggested   PLAN: DSolara Gibson  01/28/2021 9:39 PM Medical Oncology and Hematology [facility] [place] [address]Tel. [phone]   Fax. [phone]  This document serves as a record of services personally performed by GLurline Del MD. It was created on his behalf by KWilburn Mylar a trained medical scribe. The creation of this record is based on the scribe's personal observations and the provider's statements to them.   I, GLurline DelMD, have reviewed the above documentation for accuracy and completeness, and I agree with the above.   *Total Encounter Time as defined by the Centers for Medicare and Medicaid Services includes, in addition to the face-to-face time of a patient visit (documented in the note above) non-face-to-face time: obtaining and reviewing outside history, ordering and reviewing medications, tests or procedures, care coordination (communications with other health care professionals or caregivers) and documentation in the medical record.

## 2021-01-29 ENCOUNTER — Other Ambulatory Visit: Payer: PRIVATE HEALTH INSURANCE

## 2021-01-29 ENCOUNTER — Ambulatory Visit (HOSPITAL_BASED_OUTPATIENT_CLINIC_OR_DEPARTMENT_OTHER): Payer: PRIVATE HEALTH INSURANCE | Admitting: Oncology

## 2021-01-29 ENCOUNTER — Encounter: Payer: Self-pay | Admitting: Oncology

## 2021-01-29 DIAGNOSIS — C50412 Malignant neoplasm of upper-outer quadrant of left female breast: Secondary | ICD-10-CM

## 2021-01-29 DIAGNOSIS — Z170 Estrogen receptor positive status [ER+]: Secondary | ICD-10-CM

## 2021-03-31 ENCOUNTER — Encounter: Payer: Self-pay | Admitting: Internal Medicine

## 2021-10-27 ENCOUNTER — Telehealth: Payer: Self-pay | Admitting: Oncology

## 2021-10-27 NOTE — Telephone Encounter (Signed)
.  Called patient to schedule appointment per 4/24 inbasket with iruku patient notified me she was interested in seeing Lorenso Courier, practice admin notified ?

## 2021-11-09 IMAGING — MG MM PLC BREAST LOC DEV 1ST LESION INC MAMMO GUIDE*L*
8 of 14 series · 8 of 22 positions shown · non-contrast
Comparison: Previous exam(s).

CLINICAL DATA: 63-year-old patient presents for radioactive seed
localization prior to lumpectomy for left breast cancer. The ribbon
shaped biopsy clip was approximately 1 cm medially displaced on the
post biopsy clip mammogram from June 06, 2019 and after the
biopsy of the small biopsy mass was obscured by the post biopsy
changes. Therefore, today prior to beginning the procedure CC and
MLO views of the whole breast with tomography were performed as part
of the procedure.

EXAM:
MAMMOGRAPHIC GUIDED RADIOACTIVE SEED LOCALIZATION OF THE LEFT BREAST

[L ML (1 of 6)]
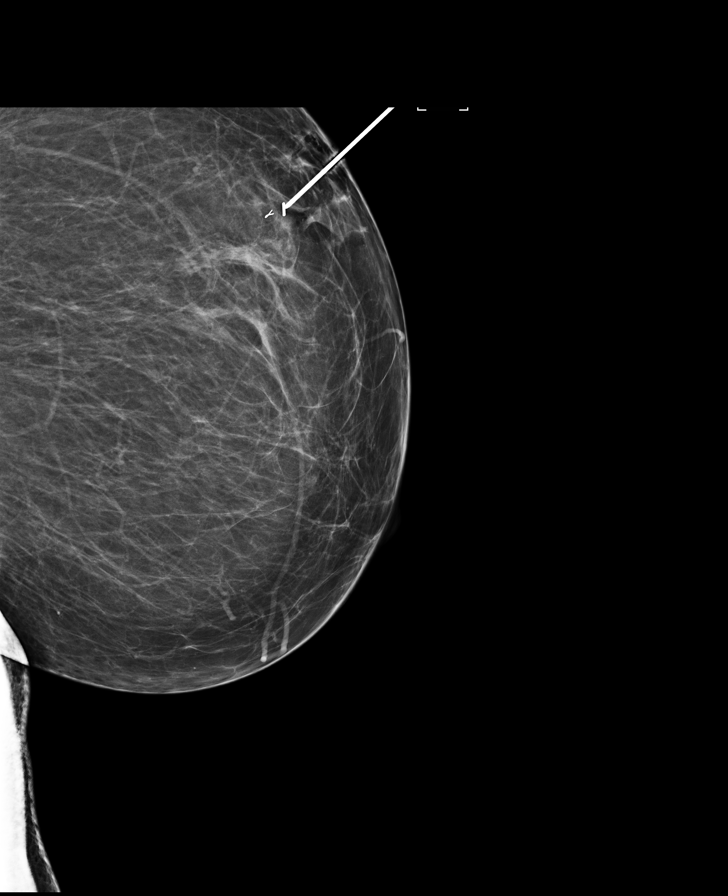

[L CC (1 of 2)]
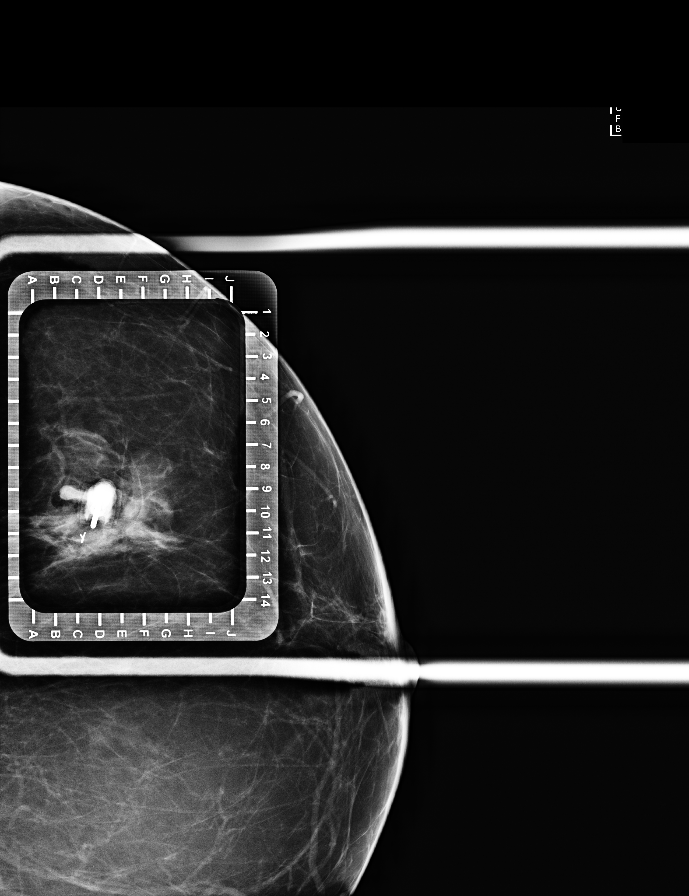

[L ML (2 of 6)]
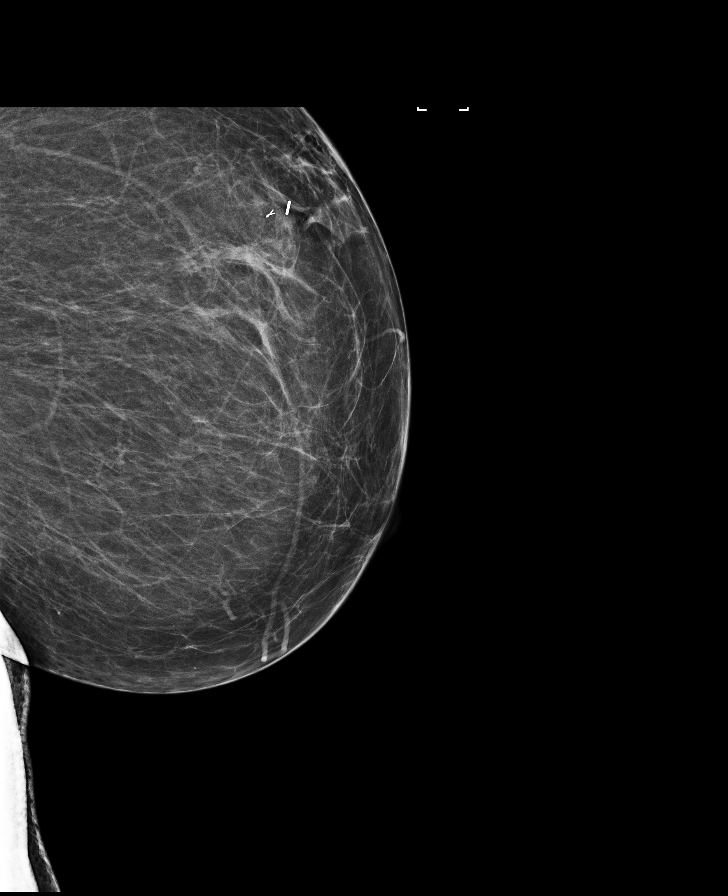

[L CC (2 of 2)]
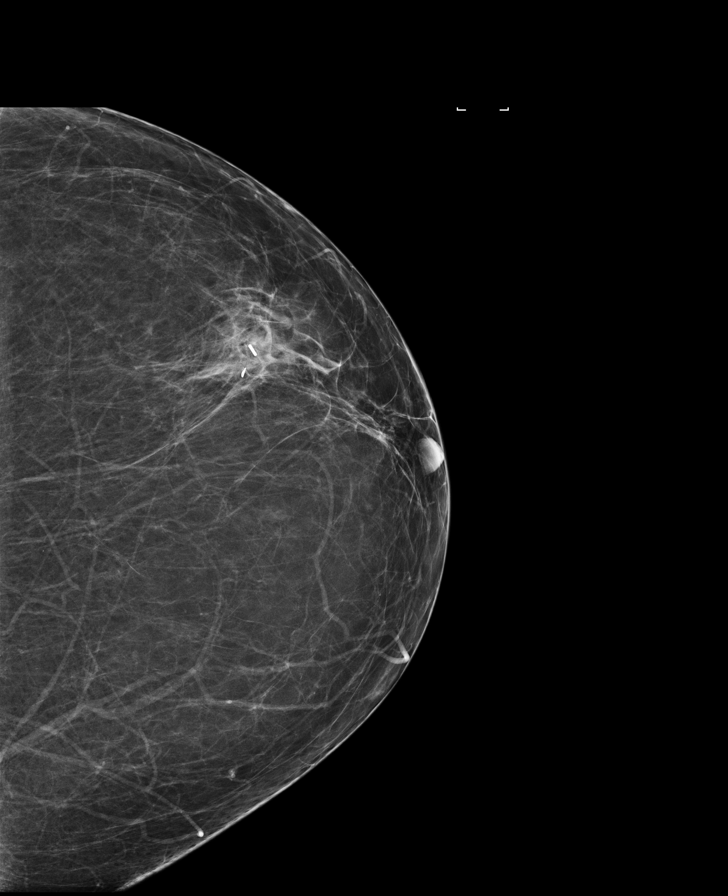

[L ML (3 of 6)]
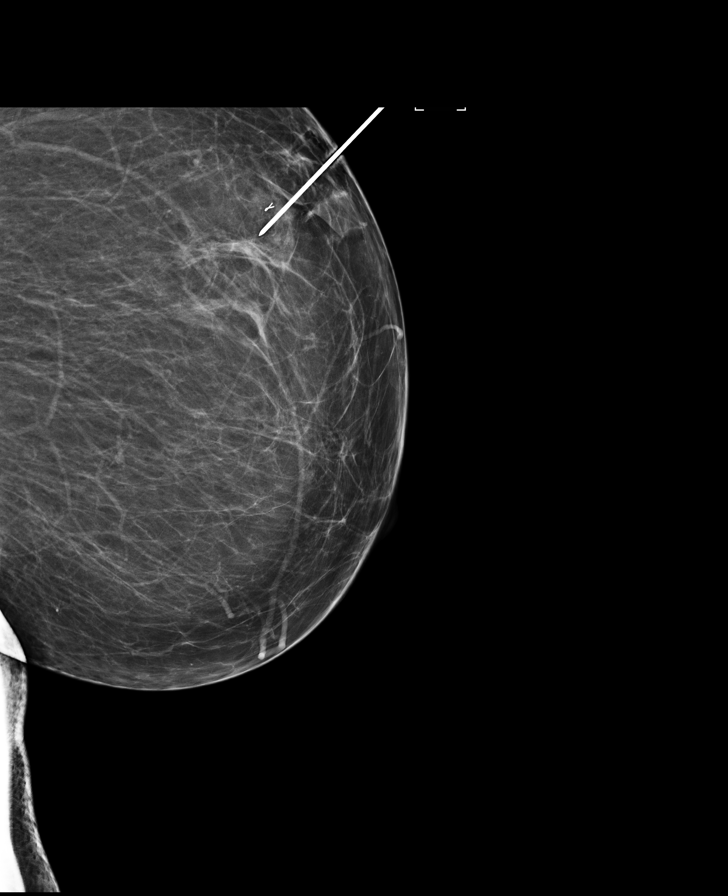

[L ML (4 of 6)]
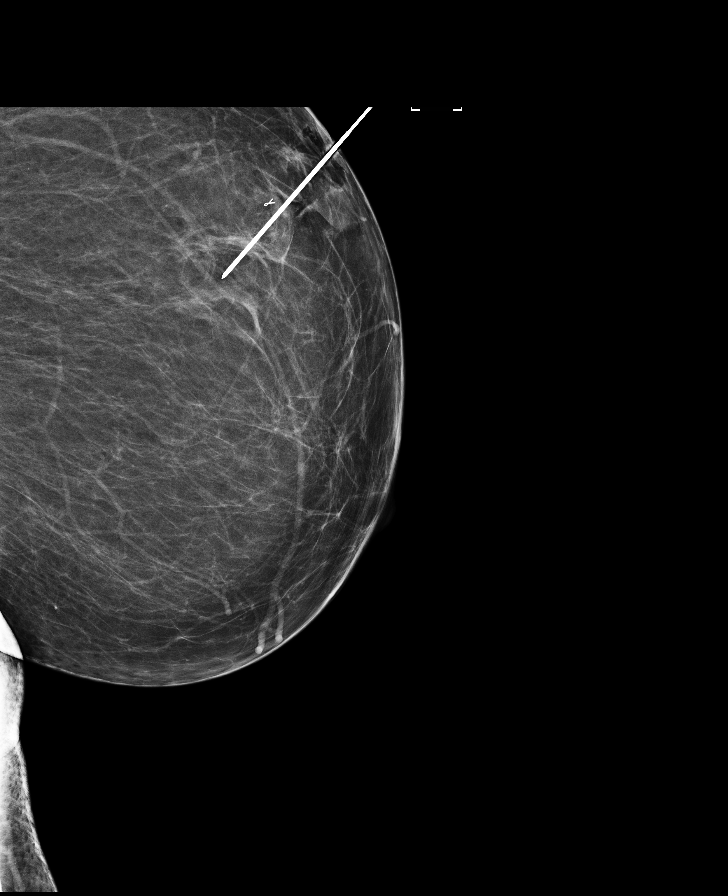

[L ML (5 of 6)]
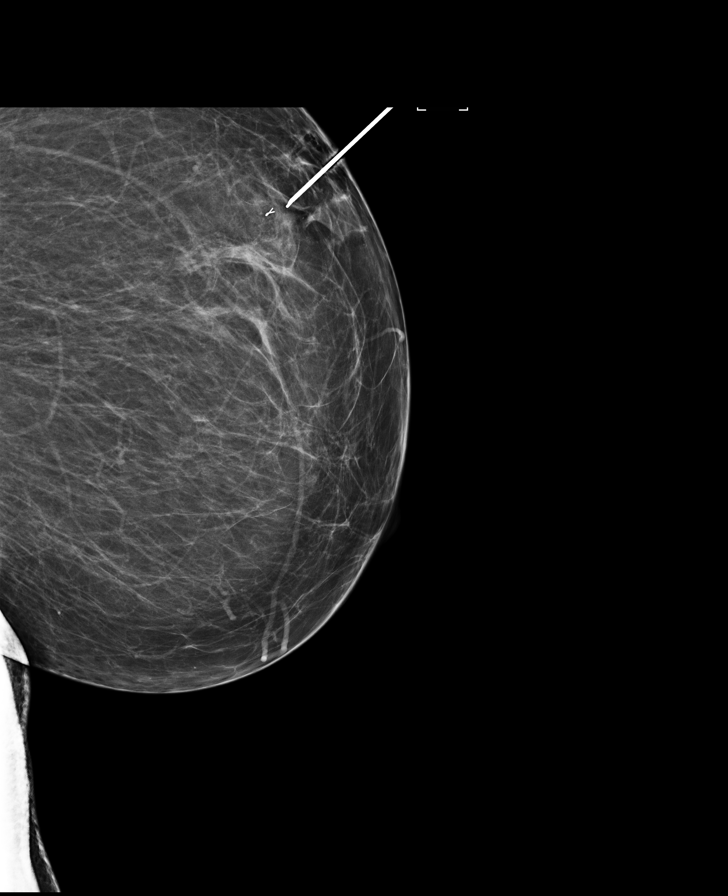

[L ML (6 of 6)]
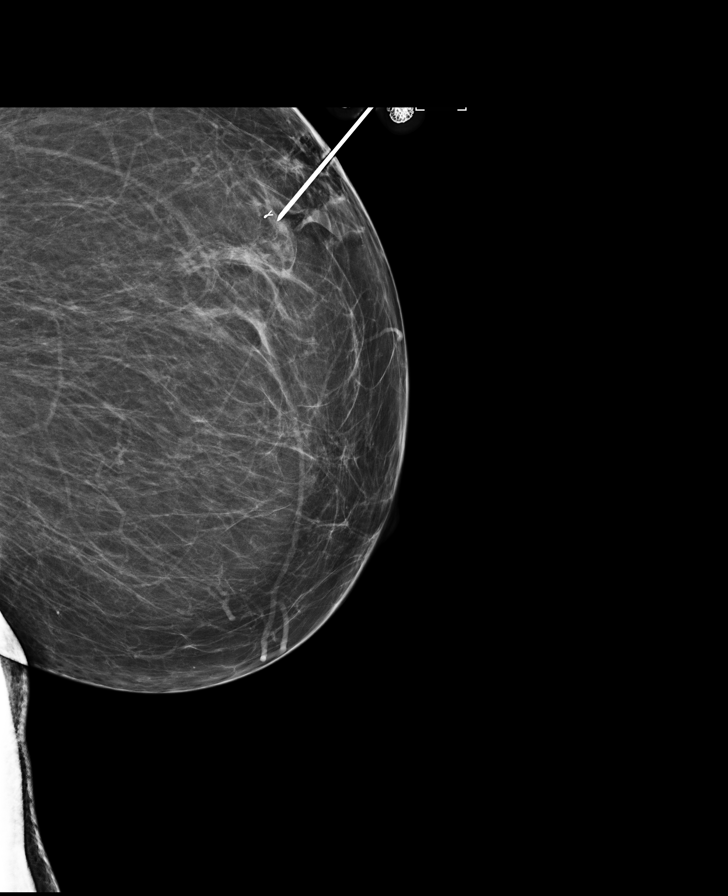

[8 of 22 positions shown; findings below may reference images not displayed]

FINDINGS: Patient presents for radioactive seed localization prior to
lumpectomy. I met with the patient and we discussed the procedure of
seed localization including benefits and alternatives. We discussed
the high likelihood of a successful procedure. We discussed the
risks of the procedure including infection, bleeding, tissue injury
and further surgery. We discussed the low dose of radioactivity
involved in the procedure. Informed, written consent was given.

The usual time-out protocol was performed immediately prior to the
procedure.

Whole breast CC and MLO views performed prior to the procedure and
show the small mass in the anterior third of the upper outer left
breast. The previously seen post biopsy changes on the post biopsy
mammogram from June 06, 2019 have nearly completely resolved. The
ribbon shaped biopsy clip is now positioned slightly closer to the
mass than it was on the post biopsy clip marker mammogram on the day
of the biopsy June 06, 2019.

Using mammographic guidance, sterile technique, 1% lidocaine and an
3-GVQ radioactive seed, the small mass in the upper-outer quadrant
of the left breast and the adjacent ribbon shaped biopsy clip were
localized using a superior approach. The follow-up mammogram images
confirm the seed in the expected location and were marked for Dr.
Jardine.

Follow-up survey of the patient confirms presence of the radioactive
seed.

Order number of 3-GVQ seed:  [id number].

Total activity:  0.246 millicuries reference Date: 04 June, 2019

The patient tolerated the procedure well and was released from the
[REDACTED]. She was given instructions regarding seed removal.
IMPRESSION: Radioactive seed localization left breast. No apparent
complications.

## 2021-11-24 ENCOUNTER — Ambulatory Visit: Payer: Self-pay | Admitting: Hematology and Oncology

## 2021-11-24 ENCOUNTER — Other Ambulatory Visit: Payer: PRIVATE HEALTH INSURANCE

## 2021-12-08 ENCOUNTER — Encounter: Payer: Self-pay | Admitting: Internal Medicine

## 2021-12-08 ENCOUNTER — Other Ambulatory Visit: Payer: Self-pay

## 2021-12-08 ENCOUNTER — Other Ambulatory Visit: Payer: Self-pay | Admitting: Internal Medicine

## 2021-12-08 DIAGNOSIS — Z9889 Other specified postprocedural states: Secondary | ICD-10-CM

## 2021-12-08 MED ORDER — ANASTROZOLE 1 MG PO TABS: 1.0000 mg | ORAL_TABLET | Freq: Every day | ORAL | 3 refills | Status: DC

## 2021-12-14 ENCOUNTER — Ambulatory Visit: Payer: No Typology Code available for payment source | Admitting: Hematology and Oncology

## 2022-01-11 ENCOUNTER — Ambulatory Visit (AMBULATORY_SURGERY_CENTER): Payer: Self-pay | Admitting: *Deleted

## 2022-01-11 VITALS — Ht 64.0 in | Wt 238.0 lb

## 2022-01-11 DIAGNOSIS — Z86010 Personal history of colonic polyps: Secondary | ICD-10-CM

## 2022-01-11 MED ORDER — NA SULFATE-K SULFATE-MG SULF 17.5-3.13-1.6 GM/177ML PO SOLN: 1.0000 | Freq: Once | ORAL | 0 refills | Status: AC

## 2022-01-11 NOTE — Progress Notes (Signed)
No egg or soy allergy known to patient  No issues known to pt with past sedation with any surgeries or procedures Patient denies ever being told they had issues or difficulty with intubation  No FH of Malignant Hyperthermia Pt is not on diet pills Pt is not on  home 02  Pt is not on blood thinners  Pt denies issues with constipation  No A fib or A flutter Have any cardiac testing pending--Pt denies  Pt instructed to use Singlecare.com or GoodRx for a price reduction on prep

## 2022-02-03 ENCOUNTER — Other Ambulatory Visit: Payer: Self-pay | Admitting: Internal Medicine

## 2022-02-03 DIAGNOSIS — R911 Solitary pulmonary nodule: Secondary | ICD-10-CM

## 2022-02-04 DIAGNOSIS — M170 Bilateral primary osteoarthritis of knee: Secondary | ICD-10-CM | POA: Diagnosis not present

## 2022-02-09 ENCOUNTER — Encounter: Payer: Self-pay | Admitting: Internal Medicine

## 2022-02-11 ENCOUNTER — Other Ambulatory Visit: Payer: Self-pay | Admitting: *Deleted

## 2022-02-11 MED ORDER — ANASTROZOLE 1 MG PO TABS: 1.0000 mg | ORAL_TABLET | Freq: Every day | ORAL | 3 refills | Status: DC

## 2022-02-12 ENCOUNTER — Inpatient Hospital Stay: Payer: BC Managed Care – PPO | Attending: Hematology and Oncology | Admitting: Hematology and Oncology

## 2022-02-12 ENCOUNTER — Other Ambulatory Visit: Payer: Self-pay

## 2022-02-12 ENCOUNTER — Inpatient Hospital Stay: Payer: BC Managed Care – PPO

## 2022-02-12 VITALS — BP 152/94 | HR 70 | Temp 97.5°F | Resp 14 | Wt 238.9 lb

## 2022-02-12 DIAGNOSIS — Z87891 Personal history of nicotine dependence: Secondary | ICD-10-CM | POA: Insufficient documentation

## 2022-02-12 DIAGNOSIS — Z923 Personal history of irradiation: Secondary | ICD-10-CM | POA: Diagnosis not present

## 2022-02-12 DIAGNOSIS — Z170 Estrogen receptor positive status [ER+]: Secondary | ICD-10-CM | POA: Diagnosis not present

## 2022-02-12 DIAGNOSIS — Z853 Personal history of malignant neoplasm of breast: Secondary | ICD-10-CM | POA: Diagnosis not present

## 2022-02-12 DIAGNOSIS — C50412 Malignant neoplasm of upper-outer quadrant of left female breast: Secondary | ICD-10-CM | POA: Diagnosis not present

## 2022-02-12 DIAGNOSIS — C50811 Malignant neoplasm of overlapping sites of right female breast: Secondary | ICD-10-CM | POA: Diagnosis not present

## 2022-02-12 DIAGNOSIS — Z800 Family history of malignant neoplasm of digestive organs: Secondary | ICD-10-CM | POA: Insufficient documentation

## 2022-02-12 DIAGNOSIS — Z79811 Long term (current) use of aromatase inhibitors: Secondary | ICD-10-CM | POA: Insufficient documentation

## 2022-02-12 DIAGNOSIS — Z8051 Family history of malignant neoplasm of kidney: Secondary | ICD-10-CM | POA: Insufficient documentation

## 2022-02-12 DIAGNOSIS — R911 Solitary pulmonary nodule: Secondary | ICD-10-CM | POA: Diagnosis not present

## 2022-02-12 LAB — CBC WITH DIFFERENTIAL (CANCER CENTER ONLY)
Abs Immature Granulocytes: 0.11 10*3/uL — ABNORMAL HIGH (ref 0.00–0.07)
Basophils Absolute: 0.1 10*3/uL (ref 0.0–0.1)
Basophils Relative: 1 %
Eosinophils Absolute: 0.1 10*3/uL (ref 0.0–0.5)
Eosinophils Relative: 1 %
HCT: 39.7 % (ref 36.0–46.0)
Hemoglobin: 13.8 g/dL (ref 12.0–15.0)
Immature Granulocytes: 1 %
Lymphocytes Relative: 17 %
Lymphs Abs: 2.3 10*3/uL (ref 0.7–4.0)
MCH: 30.3 pg (ref 26.0–34.0)
MCHC: 34.8 g/dL (ref 30.0–36.0)
MCV: 87.1 fL (ref 80.0–100.0)
Monocytes Absolute: 1 10*3/uL (ref 0.1–1.0)
Monocytes Relative: 8 %
Neutro Abs: 9.8 10*3/uL — ABNORMAL HIGH (ref 1.7–7.7)
Neutrophils Relative %: 72 %
Platelet Count: 269 10*3/uL (ref 150–400)
RBC: 4.56 MIL/uL (ref 3.87–5.11)
RDW: 12.9 % (ref 11.5–15.5)
WBC Count: 13.4 10*3/uL — ABNORMAL HIGH (ref 4.0–10.5)
nRBC: 0 % (ref 0.0–0.2)

## 2022-02-12 LAB — CMP (CANCER CENTER ONLY)
ALT: 14 U/L (ref 0–44)
AST: 12 U/L — ABNORMAL LOW (ref 15–41)
Albumin: 3.9 g/dL (ref 3.5–5.0)
Alkaline Phosphatase: 102 U/L (ref 38–126)
Anion gap: 5 (ref 5–15)
BUN: 13 mg/dL (ref 8–23)
CO2: 26 mmol/L (ref 22–32)
Calcium: 8.8 mg/dL — ABNORMAL LOW (ref 8.9–10.3)
Chloride: 106 mmol/L (ref 98–111)
Creatinine: 0.79 mg/dL (ref 0.44–1.00)
GFR, Estimated: >60 mL/min (ref >60)
Glucose, Bld: 101 mg/dL — ABNORMAL HIGH (ref 70–99)
Potassium: 3.9 mmol/L (ref 3.5–5.1)
Sodium: 137 mmol/L (ref 135–145)
Total Bilirubin: 0.6 mg/dL (ref 0.3–1.2)
Total Protein: 6.7 g/dL (ref 6.5–8.1)

## 2022-02-12 NOTE — Progress Notes (Signed)
[facility] [phone]   [phone]  PROGRESS NOTE  Patient Care Team: Shon Baton, MD as PCP - General (Internal Medicine) Coralie Keens, MD as Consulting Physician (General Surgery) [name], MD as Consulting Physician (Radiation Oncology) Mauro Kaufmann, RN as Oncology Nurse Navigator Rockwell Germany, RN as Oncology Nurse Navigator Christophe Louis, MD as Consulting Physician (Obstetrics and Gynecology) Orson Slick, MD as Consulting Physician (Hematology and Oncology)  Hematological/Oncological History #Bilateral ER/PR+ Breast Cancer 1) status post right lumpectomy and sentinel lymph node biopsy 05/13/2011 for a T1c N0, stage IA invasive ductal carcinoma, grade 1, estrogen receptor 69% and progesterone receptor 84% positive, with an MIB-1 of 6%, and no HER-2 amplification.              (a) completed adjuvant radiation January 2013              (b) tamoxifen started January 2013 and taken irregularly for up to 3 years per patient recollection   (2) status post left breast upper outer quadrant biopsy 06/06/2019 for a clinical T1a N0, stage IA's of ductal carcinoma, grade 1, estrogen and progesterone receptor positive, HER-2 nonamplified, with an MIB-1 of 10%   (3) genetics testing pending   (4) status post left lumpectomy and sentinel lymph node sampling 07/12/2019 for a pT1b pN0, stage IA invasive ductal carcinoma, grade 1, with negative margins             (a) the single sentinel lymph node removed was negative   (5) adjuvant radiation 08/13/2019 through 09/10/2019  (6) started anastrozole 10/04/2019             (a) dose cut to 1/2 tablet daily 10/22/2019, and poor tolerance   (7) chest CT scan 08/09/2019 showed a 0.6 cm right middle lobe pulmonary nodule stable as compared to August 2019, 53-monthfollow-up CT without contrast suggested  (8) 02/12/2022: transition care to Dr. DLorenso Courierdue to Dr. MVirgie Dadretiring.   Interval History:   DNOELE ICENHOUR89y.o. female with medical history significant for bilateral ER/PR+ breast cancer who presents for a follow up visit. The patient's last visit was on 01/23/2020. In the interim since the last visit she was to be seen on 01/29/2021 but no showed. She had a mammogram in 2022 with BIRADS-2 results.   On exam today Mrs. MMorminowas last seen by Dr. MJana Hakimin 2021.  She missed her last visit in 2022.  She notes she has continued anastrozole 0.5 mg p.o. daily and tolerating it well.  She did not tolerate full dose as it was causing hot flashes and she "did not like the way she felt.  She does have some occasional joint pain and notes that she is "up for knee replacements".  She notes that she does take Celebrex for her joint issues.  She notes that medication is not cost inhibitive.  She otherwise denies any fevers, chills, sweats, nausea, vomiting or diarrhea.  Weight has been stable and she has had no other major changes in her health.  She has not noticed any abnormalities on self breast exam.  Full 10 point ROS is listed below.  MEDICAL HISTORY:  Past Medical History:  Diagnosis Date   Allergy    Anxiety    no medications for anxiety   Arthritis    [place]   [place]   [place]   GERD (gastroesophageal reflux disease)  Hypothyroid    Personal history of radiation therapy    2012 right breast   Personal history of radiation therapy    2021 left breast   PONV (postoperative nausea and vomiting)    with first breast surgery   Psoriasis    S/P radiation therapy 06/17/2011 - 08/04/2011   Right Breast - 45 Gy/25 fractions whole breast, and 16 Gy/8 fractions for boost    SURGICAL HISTORY: Past Surgical History:  Procedure Laterality Date   BREAST LUMPECTOMY Right    2012   BREAST LUMPECTOMY Left    2021   BREAST LUMPECTOMY WITH RADIOACTIVE SEED AND SENTINEL LYMPH NODE BIOPSY Left 07/12/2019   Procedure: LEFT BREAST LUMPECTOMY WITH  RADIOACTIVE SEED AND SENTINEL LYMPH NODE BIOPSY;  Surgeon: Coralie Keens, MD;  Location: [facility];  Service: General;  Laterality: Left;   BREAST SURGERY  05/13/2011   Rt Breast Lumpectomy with sentinel node biospy, invasive ductal ca, 0/2 nodes    COLONOSCOPY     POLYPECTOMY     TONSILLECTOMY     TUBAL LIGATION     At age 87    SOCIAL HISTORY: Social History   Socioeconomic History   Marital status: Divorced    Spouse name: Not on file   Number of children: Not on file   Years of education: Not on file   Highest education level: Not on file  Occupational History   Occupation: insurance adm  Tobacco Use   Smoking status: Former    Packs/day: 0.30    Years: 30.00    Total pack years: 9.00    Types: Cigarettes    Quit date: 05/09/2009    Years since quitting: 12.7   Smokeless tobacco: Never  Vaping Use   Vaping Use: Never used  Substance and Sexual Activity   Alcohol use: No   Drug use: No   Sexual activity: Never    Comment: menarche age 49 Gx, P0, perimenopsusal prior to tx, No BC  Other Topics Concern   Not on file  Social History Narrative   No children   Social Determinants of Health   Financial Resource Strain: Not on file  Food Insecurity: Not on file  Transportation Needs: Not on file  Physical Activity: Not on file  Stress: Not on file  Social Connections: Not on file  Intimate Partner Violence: Not on file    FAMILY HISTORY: Family History  Problem Relation Age of Onset   Cancer Mother        kidney   Kidney cancer Mother    Cancer Maternal Grandmother        colon   Colon cancer Maternal Grandmother    Cancer Paternal Grandmother        colon   Colon cancer Paternal Grandmother    Diabetes Neg Hx    Rectal cancer Neg Hx    Stomach cancer Neg Hx    Colon polyps Neg Hx     ALLERGIES:  has No Known Allergies.  MEDICATIONS:  Current Outpatient Medications  Medication Sig Dispense Refill   ALPRAZolam (XANAX) 0.5 MG  tablet Take 0.5 mg by mouth daily as needed.     anastrozole (ARIMIDEX) 1 MG tablet Take 1 tablet (1 mg total) by mouth daily. 90 tablet 3   ascorbic acid (VITAMIN C) 1000 MG tablet 1 qd Oral     atorvastatin (LIPITOR) 10 MG tablet Take 10 mg by mouth daily.     celecoxib (CELEBREX) 200 MG capsule Take  200 mg by mouth 2 (two) times daily.     Cholecalciferol (VITAMIN D) 1000 UNITS capsule Take 1,000 Units by mouth daily.       levothyroxine (SYNTHROID, LEVOTHROID) 137 MCG tablet TAKE 1 TABLET BY MOUTH EVERY DAY 90 tablet 0   minocycline (MINOCIN) 100 MG capsule Take 100 mg by mouth daily.     omeprazole (PRILOSEC) 20 MG capsule Take 20 mg by mouth daily.     OVER THE COUNTER MEDICATION Vitamin B 12 5000 mcg two tablets daily.     No current facility-administered medications for this visit.    REVIEW OF SYSTEMS:   Constitutional: ( - ) fevers, ( - )  chills , ( - ) night sweats Eyes: ( - ) blurriness of vision, ( - ) double vision, ( - ) watery eyes Ears, nose, mouth, throat, and face: ( - ) mucositis, ( - ) sore throat Respiratory: ( - ) cough, ( - ) dyspnea, ( - ) wheezes Cardiovascular: ( - ) palpitation, ( - ) chest discomfort, ( - ) lower extremity swelling Gastrointestinal:  ( - ) nausea, ( - ) heartburn, ( - ) change in bowel habits Skin: ( - ) abnormal skin rashes Lymphatics: ( - ) new lymphadenopathy, ( - ) easy bruising Neurological: ( - ) numbness, ( - ) tingling, ( - ) new weaknesses Behavioral/Psych: ( - ) mood change, ( - ) new changes  All other systems were reviewed with the patient and are negative.  PHYSICAL EXAMINATION:  Vitals:   02/12/22 0818  BP: (!) 152/94  Pulse: 70  Resp: 14  Temp: (!) 97.5 F (36.4 C)  SpO2: 100%   Filed Weights   02/12/22 0818  Weight: 238 lb 14.4 oz (108.4 kg)    GENERAL: Well-appearing middle-aged Caucasian female, alert, no distress and comfortable SKIN: skin color, texture, turgor are normal, no rashes or significant  lesions EYES: conjunctiva are pink and non-injected, sclera clear BREAST: deferred per patient request. Mammogram upcoming this month.  LUNGS: clear to auscultation and percussion with normal breathing effort HEART: regular rate & rhythm and no murmurs and no lower extremity edema ABDOMEN: soft, non-tender, non-distended, normal bowel sounds Musculoskeletal: no cyanosis of digits and no clubbing  PSYCH: alert & oriented x 3, fluent speech NEURO: no focal motor/sensory deficits  LABORATORY DATA:  I have reviewed the data as listed    Latest Ref Rng & Units 02/12/2022    8:53 AM 01/23/2020    3:14 PM 09/05/2019    3:58 PM  CBC  WBC 4.0 - 10.5 K/uL 13.4  11.4  10.7   Hemoglobin 12.0 - 15.0 g/dL 13.8  12.8  13.5   Hematocrit 36.0 - 46.0 % 39.7  37.4  40.4   Platelets 150 - 400 K/uL 269  232  262        Latest Ref Rng & Units 02/12/2022    8:53 AM 01/23/2020    3:14 PM 09/05/2019    3:58 PM  CMP  Glucose 70 - 99 mg/dL 101  110  108   BUN 8 - 23 mg/dL 13  16  21    Creatinine 0.44 - 1.00 mg/dL 0.79  0.77  0.78   Sodium 135 - 145 mmol/L 137  141  141   Potassium 3.5 - 5.1 mmol/L 3.9  3.6  4.0   Chloride 98 - 111 mmol/L 106  110  107   CO2 22 - 32 mmol/L 26  22  24  Calcium 8.9 - 10.3 mg/dL 8.8  9.3  9.1   Total Protein 6.5 - 8.1 g/dL 6.7  6.7  6.8   Total Bilirubin 0.3 - 1.2 mg/dL 0.6  0.3  0.3   Alkaline Phos 38 - 126 U/L 102  106  107   AST 15 - 41 U/L 12  13  14    ALT 0 - 44 U/L 14  15  14      RADIOGRAPHIC STUDIES: No results found.  ASSESSMENT & PLAN AISLYNN CIFELLI 66 y.o. female with medical history significant for bilateral ER/PR+ breast cancer who presents for a follow up visit.   After review the labs, review the records, discussion with the patient the findings are most consistent with bilateral ER/PR positive breast cancer occurring in the breast bilaterally.  These occurred at different times.  She is currently on anastrozole therapy and we will plan to continue it  for 7 to 10 years.  Will plan to continue this therapy until at least 2028.  At this time would recommend continued annual mammograms.  Patient does perform regular self exams every 1 to 2 months.  We are happy to see the patient back sooner.  Patient would like to continue annual visits.  #Bilateral ER/PR+ Breast Cancer --currently on anastrazole 0.5 mg PO daily (1/2 tablet)  --mammogram scheduled for 03/01/2022 --CT Chest WO on 03/10/2022 to assess previously noted lung nodule (6 mm in 2021)  --labs to be collected today. Results pending.  --RTC in 12 months time to re-evaluate.   Orders Placed This Encounter  Procedures   CBC with Differential ([facility] Only)    Standing Status:   Future    Number of Occurrences:   1    Standing Expiration Date:   02/13/2023   CMP ([facility] only)    Standing Status:   Future    Number of Occurrences:   1    Standing Expiration Date:   02/13/2023    All questions were answered. The patient knows to call the clinic with any problems, questions or concerns.  A total of more than 40 minutes were spent on this encounter with face-to-face time and non-face-to-face time, including preparing to see the patient, ordering tests and/or medications, counseling the patient and coordination of care as outlined above.   Ledell Peoples, MD Department of Hematology/Oncology [place] [facility] Phone: [phone] Pager: [phone] Email: [email or web address]  02/12/2022 10:08 AM

## 2022-02-15 ENCOUNTER — Encounter: Payer: Self-pay | Admitting: Internal Medicine

## 2022-02-15 ENCOUNTER — Ambulatory Visit (AMBULATORY_SURGERY_CENTER): Payer: BC Managed Care – PPO | Admitting: Internal Medicine

## 2022-02-15 VITALS — BP 138/71 | HR 75 | Temp 97.3°F | Resp 18 | Ht 64.0 in | Wt 238.0 lb

## 2022-02-15 DIAGNOSIS — Z86010 Personal history of colonic polyps: Secondary | ICD-10-CM

## 2022-02-15 DIAGNOSIS — D122 Benign neoplasm of ascending colon: Secondary | ICD-10-CM | POA: Diagnosis not present

## 2022-02-15 DIAGNOSIS — Z09 Encounter for follow-up examination after completed treatment for conditions other than malignant neoplasm: Secondary | ICD-10-CM | POA: Diagnosis not present

## 2022-02-15 DIAGNOSIS — D124 Benign neoplasm of descending colon: Secondary | ICD-10-CM

## 2022-02-15 DIAGNOSIS — D123 Benign neoplasm of transverse colon: Secondary | ICD-10-CM

## 2022-02-15 DIAGNOSIS — Z1211 Encounter for screening for malignant neoplasm of colon: Secondary | ICD-10-CM | POA: Diagnosis not present

## 2022-02-15 MED ORDER — SODIUM CHLORIDE 0.9 % IV SOLN: 500.0000 mL | Freq: Once | INTRAVENOUS | Status: DC

## 2022-02-15 NOTE — Op Note (Signed)
[facility] Patient Name: Kristen Gibson Procedure Date: 02/15/2022 7:59 AM MRN: [id number] Endoscopist: Docia Chuck. Henrene Pastor , MD Age: 66 Referring MD:  Date of Birth: 1955/12/29 Gender: Female Account #: [id number] Procedure:                Colonoscopy with cold snare polypectomy x 5 Indications:              High risk colon cancer surveillance: Personal                            history of multiple (3 or more) adenomas. Previous                            examinations 2006, 2012, 2019 Medicines:                Monitored Anesthesia Care Procedure:                Pre-Anesthesia Assessment:                           - Prior to the procedure, a History and Physical                            was performed, and patient medications and                            allergies were reviewed. The patient's tolerance of                            previous anesthesia was also reviewed. The risks                            and benefits of the procedure and the sedation                            options and risks were discussed with the patient.                            All questions were answered, and informed consent                            was obtained. Prior Anticoagulants: The patient has                            taken no previous anticoagulant or antiplatelet                            agents. ASA Grade Assessment: II - A patient with                            mild systemic disease. After reviewing the risks                            and benefits, the patient was deemed in  satisfactory condition to undergo the procedure.                           After obtaining informed consent, the colonoscope                            was passed under direct vision. Throughout the                            procedure, the patient's blood pressure, pulse, and                            oxygen saturations were monitored continuously. The                             Olympus CF-HQ190L [id number]) Colonoscope was                            introduced through the anus and advanced to the the                            cecum, identified by appendiceal orifice and                            ileocecal valve. The ileocecal valve, appendiceal                            orifice, and rectum were photographed. The quality                            of the bowel preparation was excellent. The                            colonoscopy was performed without difficulty. The                            patient tolerated the procedure well. The bowel                            preparation used was SUPREP via split dose                            instruction. Scope In: 8:07:45 AM Scope Out: 8:28:19 AM Scope Withdrawal Time: 0 hours 12 minutes 38 seconds  Total Procedure Duration: 0 hours 20 minutes 34 seconds  Findings:                 Five polyps were found in the descending colon,                            transverse colon and ascending colon. The polyps                            were 2 to 5 mm in size. These polyps were removed  with a cold snare. Resection and retrieval were                            complete.                           Multiple diverticula were found in the sigmoid                            colon.                           The exam was otherwise without abnormality on                            direct and retroflexion views. Complications:            No immediate complications. Estimated blood loss:                            None. Estimated Blood Loss:     Estimated blood loss: none. Impression:               - Five 2 to 5 mm polyps in the descending colon, in                            the transverse colon and in the ascending colon,                            removed with a cold snare. Resected and retrieved.                           - Diverticulosis in the sigmoid colon.                           - The examination  was otherwise normal on direct                            and retroflexion views. Recommendation:           - Repeat colonoscopy in 5 years for surveillance.                           - Patient has a contact number available for                            emergencies. The signs and symptoms of potential                            delayed complications were discussed with the                            patient. Return to normal activities tomorrow.                            Written discharge instructions were provided to the  patient.                           - Resume previous diet.                           - Continue present medications.                           - Await pathology results. Docia Chuck. Henrene Pastor, MD 02/15/2022 8:51:51 AM This report has been signed electronically.

## 2022-02-15 NOTE — Progress Notes (Signed)
Vital signs checked by:CW  The patient states no changes in medical or surgical history since pre-visit screening on 01/11/22.

## 2022-02-15 NOTE — Progress Notes (Signed)
Called to room to assist during endoscopic procedure.  Patient ID and intended procedure confirmed with present staff. Received instructions for my participation in the procedure from the performing physician.  

## 2022-02-15 NOTE — Progress Notes (Signed)
PT taken to PACU. Monitors in place. VSS. Report given to RN. 

## 2022-02-15 NOTE — Patient Instructions (Signed)

## 2022-02-15 NOTE — Progress Notes (Signed)
HISTORY OF PRESENT ILLNESS:  Kristen Gibson is a 66 y.o. female with a history of multiple adenomatous colon polyps.  Multiple prior colonoscopic examinations.  Now for surveillance  REVIEW OF SYSTEMS:  All non-GI ROS negative. Past Medical History:  Diagnosis Date   Allergy    Anxiety    no medications for anxiety   Arthritis    Breast cancer ([facility])    2012   Cancer [facility]) 05/2011   right breast   GERD (gastroesophageal reflux disease)    Hypothyroid    Personal history of radiation therapy    2012 right breast   Personal history of radiation therapy    2021 left breast   PONV (postoperative nausea and vomiting)    with first breast surgery   Psoriasis    S/P radiation therapy 06/17/2011 - 08/04/2011   Right Breast - 45 Gy/25 fractions whole breast, and 16 Gy/8 fractions for boost    Past Surgical History:  Procedure Laterality Date   BREAST LUMPECTOMY Right    2012   BREAST LUMPECTOMY Left    2021   BREAST LUMPECTOMY WITH RADIOACTIVE SEED AND SENTINEL LYMPH NODE BIOPSY Left 07/12/2019   Procedure: LEFT BREAST LUMPECTOMY WITH RADIOACTIVE SEED AND SENTINEL LYMPH NODE BIOPSY;  Surgeon: Coralie Keens, MD;  Location: [facility];  Service: General;  Laterality: Left;   BREAST SURGERY  05/13/2011   Rt Breast Lumpectomy with sentinel node biospy, invasive ductal ca, 0/2 nodes    COLONOSCOPY     POLYPECTOMY     TONSILLECTOMY     TUBAL LIGATION     At age 17    Social History [place] [facility]  reports that she quit smoking about 12 years ago. Her smoking use included cigarettes. She has a 9.00 pack-year smoking history. She has never used smokeless tobacco. She reports that she does not drink alcohol and does not use drugs.  family history includes Cancer in her maternal grandmother, mother, and paternal grandmother; Colon cancer in her maternal grandmother and paternal grandmother; Kidney cancer in her mother.  No Known Allergies     PHYSICAL  EXAMINATION: Vital signs: BP (!) 157/77   Pulse 64   Temp (!) 97.3 F (36.3 C)   Resp (!) 21   Ht '5\' 4"'$  (1.626 m)   Wt 238 lb (108 kg)   LMP 10/07/2009   SpO2 (!) 73%   BMI 40.85 kg/m  General: Well-developed, well-nourished, no acute distress HEENT: Sclerae are anicteric, conjunctiva pink. Oral mucosa intact Lungs: Clear Heart: Regular Abdomen: soft, nontender, nondistended, no obvious ascites, no peritoneal signs, normal bowel sounds. No organomegaly. Extremities: No edema Psychiatric: alert and oriented x3. Cooperative      ASSESSMENT:  History of multiple adenomatous colon polyps   PLAN:  Surveillance colonoscopy

## 2022-02-16 ENCOUNTER — Telehealth: Payer: Self-pay

## 2022-02-16 NOTE — Telephone Encounter (Signed)
  Follow up Call-     02/15/2022    7:07 AM  Call back number  Post procedure Call Back phone  # [phone]  Permission to leave phone message Yes     Patient questions:  Do you have a fever, pain , or abdominal swelling? No. Pain Score  0 *  Have you tolerated food without any problems? Yes.    Have you been able to return to your normal activities? Yes.    Do you have any questions about your discharge instructions: Diet   No. Medications  No. Follow up visit  No.  Do you have questions or concerns about your Care? No.  Actions: * If pain score is 4 or above: No action needed, pain <4.

## 2022-02-17 ENCOUNTER — Encounter: Payer: Self-pay | Admitting: Internal Medicine

## 2022-03-01 ENCOUNTER — Ambulatory Visit
Admission: RE | Admit: 2022-03-01 | Discharge: 2022-03-01 | Disposition: A | Discharge status: Home / Self Care | Payer: BC Managed Care – PPO | Source: Ambulatory Visit | Attending: Internal Medicine | Admitting: Internal Medicine

## 2022-03-01 DIAGNOSIS — Z853 Personal history of malignant neoplasm of breast: Secondary | ICD-10-CM | POA: Diagnosis not present

## 2022-03-01 DIAGNOSIS — Z9889 Other specified postprocedural states: Secondary | ICD-10-CM

## 2022-03-01 DIAGNOSIS — R928 Other abnormal and inconclusive findings on diagnostic imaging of breast: Secondary | ICD-10-CM | POA: Diagnosis not present

## 2022-03-10 ENCOUNTER — Ambulatory Visit
Admission: RE | Admit: 2022-03-10 | Discharge: 2022-03-10 | Disposition: A | Discharge status: Home / Self Care | Payer: BC Managed Care – PPO | Source: Ambulatory Visit | Attending: Internal Medicine | Admitting: Internal Medicine

## 2022-03-10 DIAGNOSIS — R911 Solitary pulmonary nodule: Secondary | ICD-10-CM

## 2022-03-10 DIAGNOSIS — I700 Atherosclerosis of aorta: Secondary | ICD-10-CM | POA: Diagnosis not present

## 2022-03-16 ENCOUNTER — Encounter: Payer: Self-pay | Admitting: Internal Medicine

## 2022-03-16 ENCOUNTER — Ambulatory Visit: Payer: BC Managed Care – PPO | Admitting: Internal Medicine

## 2022-03-16 VITALS — BP 124/86 | HR 61 | Temp 98.0°F | Ht 64.0 in | Wt 239.6 lb

## 2022-03-16 DIAGNOSIS — R0602 Shortness of breath: Secondary | ICD-10-CM

## 2022-03-16 NOTE — Progress Notes (Signed)
Kristen Gibson    [id number]    [birth year]  Primary Care Physician:Russo, Jenny Reichmann, MD  Referring Physician: Shon Baton, [place] [place],  [place] [address] Reason for Consultation: shortness of breath Date of Consultation: 03/16/2022  Chief complaint:   Chief Complaint  Patient presents with   Shortness of Breath     HPI:  Kristen Gibson is a 66 y.o. woman with previous tobacco use disorder who presents for new patient evaluation for shortness of breath. Additional history of breast cancer Treated in 2012(right) and 2021(left) with lumpectomy, radiation. Now on tamoxifen.   She had covid in 2020 and was sick for about a month. Symptoms seemed to start after then. Symptoms are usually with exertion. Accompanied by wheezing, no chest tightness. She denies cough. No le edema. No chest pain. Feels she sleeps well. She does think she snores. Feels she sleeps well. Denies sleep apnea symptoms.   No nocturnal awakenings related to symptoms.    No childhood respiratory disease, played sports. Denies recurrent pna or bronchitis   Has seen Dr. Einar Gip in cardiology previously and has had stress test which was low risk for ischemia.   Overall she feels like she can complete her ADLs and this doesn't limit her ability to function. She wants to make sure nothing more serious is going on with her breathing.  Social history:  Occupation: works as Biomedical scientist, Museum/gallery curator  Exposures: lives independently, has a Magazine features editor.  Smoking history: approx 10 pack years quit in 2021. No passive smoke exposure.   Social History   Occupational History   Occupation: insurance adm  Tobacco Use   Smoking status: Former    Packs/day: 0.30    Years: 30.00    Total pack years: 9.00    Types: Cigarettes    Quit date: 05/09/2009    Years since quitting: 12.8   Smokeless tobacco: Never  Vaping Use   Vaping Use: Never used  Substance and Sexual Activity   Alcohol  use: No   Drug use: No   Sexual activity: Never    Comment: menarche age 4 Gx, P0, perimenopsusal prior to tx, No BC    Relevant family history:  Family History  Problem Relation Age of Onset   Cancer Mother        kidney   Kidney cancer Mother    Cancer Maternal Grandmother        colon   Colon cancer Maternal Grandmother    Cancer Paternal Grandmother        colon   Colon cancer Paternal Grandmother    Diabetes Neg Hx    Rectal cancer Neg Hx    Stomach cancer Neg Hx    Colon polyps Neg Hx     Past Medical History:  Diagnosis Date   Allergy    Anxiety    no medications for anxiety   Arthritis    Breast cancer ([place])    2012   Cancer ([place]) 05/2011   right breast   GERD (gastroesophageal reflux disease)    Hypothyroid    Personal history of radiation therapy    2012 right breast   Personal history of radiation therapy    2021 left breast   PONV (postoperative nausea and vomiting)    with first breast surgery   Psoriasis    S/P radiation therapy 06/17/2011 - 08/04/2011   Right Breast - 45 Gy/25 fractions whole breast, and 16 Gy/8  fractions for boost    Past Surgical History:  Procedure Laterality Date   BREAST LUMPECTOMY Right    2012   BREAST LUMPECTOMY Left    2021   BREAST LUMPECTOMY WITH RADIOACTIVE SEED AND SENTINEL LYMPH NODE BIOPSY Left 07/12/2019   Procedure: LEFT BREAST LUMPECTOMY WITH RADIOACTIVE SEED AND SENTINEL LYMPH NODE BIOPSY;  Surgeon: Coralie Keens, MD;  Location: [facility];  Service: General;  Laterality: Left;   BREAST SURGERY  05/13/2011   Rt Breast Lumpectomy with sentinel node biospy, invasive ductal ca, 0/2 nodes    COLONOSCOPY     POLYPECTOMY     TONSILLECTOMY     TUBAL LIGATION     At age 34     Physical Exam: Blood pressure 124/86, pulse 61, temperature 98 F (36.7 C), temperature source Oral, height '5\' 4"'$  (1.626 m), weight 239 lb 9.6 oz (108.7 kg), last menstrual period 10/07/2009, SpO2 99 %. Gen:       No acute distress ENT:  no nasal polyps, mucus membranes moist Lungs:    No increased respiratory effort, symmetric chest wall excursion, clear to auscultation bilaterally, no wheezes or crackles CV:         Regular rate and rhythm; no murmurs, rubs, or gallops.  No pedal edema Abd:      + bowel sounds; soft, non-tender; no distension MSK: no acute synovitis of DIP or PIP joints, no mechanics hands.  Skin:      Warm and dry; no rashes Neuro: normal speech, no focal facial asymmetry Psych: alert and oriented x3, normal mood and affect   Data Reviewed/Medical Decision Making:  Independent interpretation of tests: Imaging:  Review of patient's CT Chest 03/2022 images revealed 69m RML pulmonary nodule with partially subsolid component. Normal lung parenchyma. No scarring or emphysema. The patient's images have been independently reviewed by me.    PFTs:  Labs:   Immunization status:  Immunization History  Administered Date(s) Administered   Zoster Recombinat (Shingrix) 04/05/2018, 07/15/2018     I reviewed prior external note(s) from Cardiology, oncology, PCP  I reviewed the result(s) of the labs and imaging as noted above.   I have ordered PFT  Assessment:  Shortness of breath  Plan/Recommendations: Unclear etiology. Her ct scan was reassuring and her cardiac evaluation thus far has been unremarkable.  Will obtain a full set of PFTs. She is low risk for lung cancer based on her less than 10 pack year smoking history.  Probably not COPD but we shall see what breathing testing shows.   We discussed disease management and progression at length today.     Return to Care: Return in about 4 weeks (around 04/13/2022).  Kristen Llamas MD Pulmonary and Critical Care Medicine [facility] CC: RShon Baton MD

## 2022-03-16 NOTE — Patient Instructions (Signed)
Please schedule follow up scheduled with myself in 1 months.  If my schedule is not open yet, we will contact you with a reminder closer to that time. Please call [phone] if you haven't heard from [place] a month before.   Before your next visit I would like you to have: Full set of PFTs - 1 hour. Can be same day or different day based on timing/convenience.

## 2022-04-30 ENCOUNTER — Other Ambulatory Visit: Payer: Self-pay | Admitting: *Deleted

## 2022-04-30 DIAGNOSIS — R0602 Shortness of breath: Secondary | ICD-10-CM

## 2022-04-30 NOTE — Progress Notes (Signed)
Pft   

## 2022-05-13 ENCOUNTER — Encounter: Payer: Self-pay | Admitting: Internal Medicine

## 2022-05-13 ENCOUNTER — Ambulatory Visit (INDEPENDENT_AMBULATORY_CARE_PROVIDER_SITE_OTHER): Payer: BC Managed Care – PPO | Admitting: Internal Medicine

## 2022-05-13 ENCOUNTER — Ambulatory Visit: Payer: BC Managed Care – PPO | Admitting: Internal Medicine

## 2022-05-13 VITALS — BP 140/80 | HR 77 | Temp 97.4°F | Ht 64.0 in | Wt 236.0 lb

## 2022-05-13 DIAGNOSIS — R0602 Shortness of breath: Secondary | ICD-10-CM

## 2022-05-13 DIAGNOSIS — R0609 Other forms of dyspnea: Secondary | ICD-10-CM | POA: Diagnosis not present

## 2022-05-13 LAB — PULMONARY FUNCTION TEST
DL/VA % pred: 103 %
DL/VA: 4.32 ml/min/mmHg/L
DLCO cor % pred: 88 %
DLCO cor: 17.67 ml/min/mmHg
DLCO unc % pred: 88 %
DLCO unc: 17.67 ml/min/mmHg
FEF 25-75 Post: 2.94 L/sec
FEF 25-75 Pre: 2.48 L/sec
FEF2575-%Change-Post: 18 %
FEF2575-%Pred-Post: 135 %
FEF2575-%Pred-Pre: 114 %
FEV1-%Change-Post: 3 %
FEV1-%Pred-Post: 90 %
FEV1-%Pred-Pre: 87 %
FEV1-Post: 2.2 L
FEV1-Pre: 2.13 L
FEV1FVC-%Change-Post: 4 %
FEV1FVC-%Pred-Pre: 109 %
FEV6-%Change-Post: 0 %
FEV6-%Pred-Post: 81 %
FEV6-%Pred-Pre: 82 %
FEV6-Post: 2.5 L
FEV6-Pre: 2.52 L
FEV6FVC-%Pred-Post: 104 %
FEV6FVC-%Pred-Pre: 104 %
FVC-%Change-Post: 0 %
FVC-%Pred-Post: 78 %
FVC-%Pred-Pre: 79 %
FVC-Post: 2.5 L
FVC-Pre: 2.53 L
Post FEV1/FVC ratio: 88 %
Post FEV6/FVC ratio: 100 %
Pre FEV1/FVC ratio: 84 %
Pre FEV6/FVC Ratio: 100 %
RV % pred: 75 %
RV: 1.57 L
TLC % pred: 88 %
TLC: 4.46 L

## 2022-05-13 NOTE — Patient Instructions (Signed)
Your lung function is normal. Please follow up me as needed if symptoms worsen or change. I recommend initiating a regular exercise routine starting with walking to see if that helps you build your endurance. Just start small if it's intimidating - 10-15 minutes three times a week and increase gradually from there.

## 2022-05-13 NOTE — Progress Notes (Signed)
Kristen Gibson    [id number]    1955/09/14  Primary Care 58, Kristen Reichmann, MD Date of Appointment: 05/13/2022 Established Patient Visit  Chief complaint:   Chief Complaint  Patient presents with   Follow-up    F/u after PFT today.  C/o some SOB and wheeze but not every day.  Exertion makes sx worse.     HPI: Kristen Gibson is a 66 y.o. woman with history of remote smoking and Bilateral breast  cancer ER/PR positive s/p lympectomy, radiation 2021 on anastrazole. Initially presented for shortness of breath.   Interval Updates: Here for follow up after PFTS which show normal pulmonary function. She continues to have dyspnea and wheezing intermittently, made worse with exertion. (Mowing the yard, carrying something, sometimes upon awakening like today.   Denies excessive daytime sleepiness.  She does snore but denies apneas.   I have reviewed the patient's family social and past medical history and updated as appropriate.   Past Medical History:  Diagnosis Date   Allergy    Anxiety    no medications for anxiety   Arthritis    Breast cancer ([place])    2012   Cancer [facility]) 05/2011   right breast   GERD (gastroesophageal reflux disease)    Hypothyroid    Personal history of radiation therapy    2012 right breast   Personal history of radiation therapy    2021 left breast   PONV (postoperative nausea and vomiting)    with first breast surgery   Psoriasis    S/P radiation therapy 06/17/2011 - 08/04/2011   Right Breast - 45 Gy/25 fractions whole breast, and 16 Gy/8 fractions for boost    Past Surgical History:  Procedure Laterality Date   BREAST LUMPECTOMY Right    2012   BREAST LUMPECTOMY Left    2021   BREAST LUMPECTOMY WITH RADIOACTIVE SEED AND SENTINEL LYMPH NODE BIOPSY Left 07/12/2019   Procedure: LEFT BREAST LUMPECTOMY WITH RADIOACTIVE SEED AND SENTINEL LYMPH NODE BIOPSY;  Surgeon: Coralie Keens, MD;  Location: [facility];   Service: General;  Laterality: Left;   BREAST SURGERY  05/13/2011   Rt Breast Lumpectomy with sentinel node biospy, invasive ductal ca, 0/2 nodes    COLONOSCOPY     POLYPECTOMY     TONSILLECTOMY     TUBAL LIGATION     At age 24    Family History  Problem Relation Age of Onset   Cancer Mother        kidney   Kidney cancer Mother    Cancer Maternal Grandmother        colon   Colon cancer Maternal Grandmother    Cancer Paternal Grandmother        colon   Colon cancer Paternal Grandmother    Diabetes Neg Hx    Rectal cancer Neg Hx    Stomach cancer Neg Hx    Colon polyps Neg Hx     Social History   Occupational History   Occupation: insurance adm  Tobacco Use   Smoking status: Former    Packs/day: 0.30    Years: 30.00    Total pack years: 9.00    Types: Cigarettes    Quit date: 05/09/2009    Years since quitting: 13.0   Smokeless tobacco: Never  Vaping Use   Vaping Use: Never used  Substance and Sexual Activity   Alcohol use: No   Drug use: No   Sexual  activity: Never    Comment: menarche age 23 Gx, P0, perimenopsusal prior to tx, No BC     Physical Exam: Blood pressure (!) 140/80, pulse 77, temperature (!) 97.4 F (36.3 C), temperature source Oral, height '5\' 4"'$  (1.626 m), weight 236 lb (107 kg), last menstrual period 10/07/2009, SpO2 99 %.  Gen:      No acute distress Lungs:    No increased respiratory effort, symmetric chest wall excursion, clear to auscultation bilaterally, no wheezes or crackles CV:         Regular rate and rhythm; no murmurs, rubs, or gallops.  No pedal edema   Data Reviewed: Imaging: I have personally reviewed the CT Chest Sept 2023 no acute pulmonary pathology. Stable 39m pulmonary nodule which is already stable for 4 years.   PFTs:     Latest Ref Rng & Units 05/13/2022    9:32 AM  PFT Results  FVC-Pre L 2.53  P  FVC-Predicted Pre % 79  P  FVC-Post L 2.50  P  FVC-Predicted Post % 78  P  Pre FEV1/FVC % % 84  P  Post FEV1/FCV %  % 88  P  FEV1-Pre L 2.13  P  FEV1-Predicted Pre % 87  P  FEV1-Post L 2.20  P  DLCO uncorrected ml/min/mmHg 17.67  P  DLCO UNC% % 88  P  DLCO corrected ml/min/mmHg 17.67  P  DLCO COR %Predicted % 88  P  DLVA Predicted % 103  P  TLC L 4.46  P  TLC % Predicted % 88  P  RV % Predicted % 75  P    P Preliminary result   I have personally reviewed the patient's PFTs and normal pulmonary function  Labs:  Immunization status: Immunization History  Administered Date(s) Administered   Influenza-Unspecified 04/19/2022   Zoster Recombinat (Shingrix) 04/05/2018, 07/15/2018    External Records Personally Reviewed: oncology, GI,  Assessment:  Shortness of breath History of ER/PR positive breast cancer History of tobacco use disorder  Plan/Recommendations: No evidence of chronic lung disease.  We discussed a trial of prn albuterol if she feels strongly but I don't think this is smoking related lung disease and there is no reversible obstruction on PFTS. We discussed sleep disordered breathing as well and it does not appear she has symptoms consistent with this.  If cardiac evaluation has been addressed, I suspect symptoms related to deconditioning, obesity.  Recommend implementation of regular exercise routine.   Return to Care: Return if symptoms worsen or fail to improve.   NLenice Llamas MD [facility]monary and [facility]

## 2022-05-13 NOTE — Progress Notes (Signed)
Full PFT Performed Today  

## 2022-05-13 NOTE — Patient Instructions (Signed)
Full PFT Performed Today  

## 2022-07-16 ENCOUNTER — Encounter (HOSPITAL_COMMUNITY): Payer: BC Managed Care – PPO

## 2022-08-13 DIAGNOSIS — E785 Hyperlipidemia, unspecified: Secondary | ICD-10-CM | POA: Diagnosis not present

## 2022-08-13 DIAGNOSIS — M8580 Other specified disorders of bone density and structure, unspecified site: Secondary | ICD-10-CM | POA: Diagnosis not present

## 2022-08-13 DIAGNOSIS — R739 Hyperglycemia, unspecified: Secondary | ICD-10-CM | POA: Diagnosis not present

## 2022-08-13 DIAGNOSIS — E039 Hypothyroidism, unspecified: Secondary | ICD-10-CM | POA: Diagnosis not present

## 2022-08-18 ENCOUNTER — Other Ambulatory Visit: Payer: Self-pay | Admitting: Internal Medicine

## 2022-08-18 DIAGNOSIS — Z1231 Encounter for screening mammogram for malignant neoplasm of breast: Secondary | ICD-10-CM

## 2022-08-20 DIAGNOSIS — I2584 Coronary atherosclerosis due to calcified coronary lesion: Secondary | ICD-10-CM | POA: Diagnosis not present

## 2022-08-20 DIAGNOSIS — Z1331 Encounter for screening for depression: Secondary | ICD-10-CM | POA: Diagnosis not present

## 2022-08-20 DIAGNOSIS — Z23 Encounter for immunization: Secondary | ICD-10-CM | POA: Diagnosis not present

## 2022-08-20 DIAGNOSIS — I700 Atherosclerosis of aorta: Secondary | ICD-10-CM | POA: Diagnosis not present

## 2022-08-20 DIAGNOSIS — R82998 Other abnormal findings in urine: Secondary | ICD-10-CM | POA: Diagnosis not present

## 2022-08-20 DIAGNOSIS — E785 Hyperlipidemia, unspecified: Secondary | ICD-10-CM | POA: Diagnosis not present

## 2022-08-20 DIAGNOSIS — Z0000 Encounter for general adult medical examination without abnormal findings: Secondary | ICD-10-CM | POA: Diagnosis not present

## 2022-08-20 DIAGNOSIS — E039 Hypothyroidism, unspecified: Secondary | ICD-10-CM | POA: Diagnosis not present

## 2022-11-01 DIAGNOSIS — M1711 Unilateral primary osteoarthritis, right knee: Secondary | ICD-10-CM | POA: Diagnosis not present

## 2022-11-01 NOTE — H&P (Signed)
TOTAL KNEE ADMISSION H&P  Patient is being admitted for right total knee arthroplasty.  Subjective:  Chief Complaint: Right knee pain.  HPI: Kristen Gibson, 67 y.o. female has a history of pain and functional disability in the right knee due to arthritis and has failed non-surgical conservative treatments for greater than 12 weeks to include NSAID's and/or analgesics, corticosteriod injections, viscosupplementation injections, weight reduction as appropriate, and activity modification. Onset of symptoms was gradual, starting 3 years ago with gradually worsening course since that time. The patient noted no past surgery on the right knee.  Patient currently rates pain in the right knee at 7 out of 10 with activity. Patient has worsening of pain with activity and weight bearing and pain that interferes with activities of daily living. There is no active infection.  Patient Active Problem List   Diagnosis Date Noted   Malignant neoplasm of overlapping sites of right breast in female, estrogen receptor positive (HCC) 06/19/2019   Malignant neoplasm of upper-outer quadrant of left breast in female, estrogen receptor positive (HCC) 06/13/2019    Past Medical History:  Diagnosis Date   Allergy    Anxiety    no medications for anxiety   Arthritis    Breast cancer ([facility])    2012   Cancer ([facility]) 05/2011   right breast   GERD (gastroesophageal reflux disease)    Hypothyroid    Personal history of radiation therapy    2012 right breast   Personal history of radiation therapy    2021 left breast   PONV (postoperative nausea and vomiting)    with first breast surgery   Psoriasis    S/P radiation therapy 06/17/2011 - 08/04/2011   Right Breast - 45 Gy/25 fractions whole breast, and 16 Gy/8 fractions for boost    Past Surgical History:  Procedure Laterality Date   BREAST LUMPECTOMY Right    2012   BREAST LUMPECTOMY Left    2021   BREAST LUMPECTOMY WITH RADIOACTIVE SEED AND SENTINEL LYMPH  NODE BIOPSY Left 07/12/2019   Procedure: LEFT BREAST LUMPECTOMY WITH RADIOACTIVE SEED AND SENTINEL LYMPH NODE BIOPSY;  Surgeon: Abigail Miyamoto, MD;  Location: [facility];  Service: General;  Laterality: Left;   BREAST SURGERY  05/13/2011   Rt Breast Lumpectomy with sentinel node biospy, invasive ductal ca, 0/2 nodes    COLONOSCOPY     POLYPECTOMY     TONSILLECTOMY     TUBAL LIGATION     At age 5    Prior to Admission medications   Medication Sig Start Date End Date Taking? Authorizing Provider  ALPRAZolam Prudy Feeler) 0.5 MG tablet Take 0.5 mg by mouth daily as needed. 01/09/22   [provider]  amoxicillin (AMOXIL) 875 MG tablet Take 875 mg by mouth 2 (two) times daily. 05/11/22   [provider]  anastrozole (ARIMIDEX) 1 MG tablet Take 1 tablet (1 mg total) by mouth daily. 02/11/22   Jaci Standard, MD  ascorbic acid (VITAMIN C) 1000 MG tablet 1 qd Oral 06/19/15   [provider]  atorvastatin (LIPITOR) 10 MG tablet Take 10 mg by mouth daily. 01/08/22   [provider]  celecoxib (CELEBREX) 200 MG capsule Take 200 mg by mouth 2 (two) times daily.    [provider]  Cholecalciferol (VITAMIN D) 1000 UNITS capsule Take 1,000 Units by mouth daily.      [provider]  levothyroxine (SYNTHROID, LEVOTHROID) 137 MCG tablet TAKE 1 TABLET BY MOUTH EVERY DAY  06/29/13   Magrinat, Valentino Hue, MD  minocycline (MINOCIN) 100 MG capsule Take 100 mg by mouth daily. 01/08/22   [provider]  omeprazole (PRILOSEC) 20 MG capsule Take 20 mg by mouth daily. 07/06/19   [provider]  OVER THE COUNTER MEDICATION Vitamin B 12 5000 mcg two tablets daily.    [provider]  TAMIFLU 75 MG capsule Take 1 capsule po 2x a day x 5 days Orally as directed for 5 days Patient not taking: Reported on 05/13/2022 04/26/22   [provider]    No Known Allergies  Social History   Socioeconomic History   Marital status:  Divorced    Spouse name: Not on file   Number of children: Not on file   Years of education: Not on file   Highest education level: Not on file  Occupational History   Occupation: insurance adm  Tobacco Use   Smoking status: Former    Packs/day: 0.30    Years: 30.00    Additional pack years: 0.00    Total pack years: 9.00    Types: Cigarettes    Quit date: 05/09/2009    Years since quitting: 13.4   Smokeless tobacco: Never  Vaping Use   Vaping Use: Never used  Substance and Sexual Activity   Alcohol use: No   Drug use: No   Sexual activity: Never    Comment: menarche age 83 Gx, P0, perimenopsusal prior to tx, No BC  Other Topics Concern   Not on file  Social History Narrative   No children   Social Determinants of Health   Financial Resource Strain: Not on file  Food Insecurity: Not on file  Transportation Needs: Not on file  Physical Activity: Not on file  Stress: Not on file  Social Connections: Not on file  Intimate Partner Violence: Not on file    Tobacco Use: Medium Risk (05/13/2022)   Patient History    Smoking Tobacco Use: Former    Smokeless Tobacco Use: Never    Passive Exposure: Not on file   Social History   Substance and Sexual Activity  Alcohol Use No    Family History  Problem Relation Age of Onset   Cancer Mother        kidney   Kidney cancer Mother    Cancer Maternal Grandmother        colon   Colon cancer Maternal Grandmother    Cancer Paternal Grandmother        colon   Colon cancer Paternal Grandmother    Diabetes Neg Hx    Rectal cancer Neg Hx    Stomach cancer Neg Hx    Colon polyps Neg Hx     ROS  Objective:  Physical Exam: The patient is a pleasant, well-developed female alert and oriented in no apparent distress.  The patient has a minimally antalgic gait pattern favoring the right side.  Bilateral Hip Exam: The range of motion: normal without discomfort.  Right Knee Exam: No effusion present. No swelling  present. The range of motion is: 5 to 125 degrees. Positive crepitus on range of motion of the knee. Positive medial joint line tenderness. No lateral joint line tenderness. The knee is stable.  Left Knee Exam: No effusion present. No swelling present. The Range of motion is: 0 to 125 degrees. Slight crepitus on range of motion of the knee. Slight medial joint line tenderness. No lateral joint line tenderness. The knee is stable.  The patient's sensation  and motor function are intact in their lower extremities. Their distal pulses are 2+. The bilateral calves are soft and non-tender.  RESULTS  - AP and lateral of the bilateral knees dated 04/07/2021 from [facility] demonstrate bone-on-bone arthritis in the medial and patellofemoral compartments of the right knee. There is near bone-on-bone arthritis in the medial and bone on bone arthritis in the patellofemoral compartment of the left knee.    Assessment/Plan:  End stage arthritis, right knee   The patient history, physical examination, clinical judgment of the provider and imaging studies are consistent with end stage degenerative joint disease of the right knee and total knee arthroplasty is deemed medically necessary. The treatment options including medical management, injection therapy arthroscopy and arthroplasty were discussed at length. The risks and benefits of total knee arthroplasty were presented and reviewed. The risks due to aseptic loosening, infection, stiffness, patella tracking problems, thromboembolic complications and other imponderables were discussed. The patient acknowledged the explanation, agreed to proceed with the plan and consent was signed. Patient is being admitted for inpatient treatment for surgery, pain control, PT, OT, prophylactic antibiotics, VTE prophylaxis, progressive ambulation and ADLs and discharge planning. The patient is planning to be discharged  home with OPPT scheduled .   Patient's  anticipated LOS is less than 2 midnights, meeting these requirements: - Younger than 27 - Lives within 1 hour of care - Has a competent adult at home to recover with post-op recover - NO history of  - Chronic pain requiring opiods  - Diabetes  - Coronary Artery Disease  - Heart failure  - Heart attack  - Stroke  - DVT/VTE  - Cardiac arrhythmia  - Respiratory Failure/COPD  - Renal failure  - Anemia  - Advanced Liver disease    Therapy Plans: [facility] Disposition: Home with help of neighbors. Lives with brother who is sick with AML. Planned DVT Prophylaxis: Xarelto (hx breast cancer) DME Needed: None PCP: Creola Corn, MD (clearance received) TXA: IV Allergies: None Anesthesia Concerns: None BMI: 40.7 Pharmacy: [facility] [address]  Other: -Weight check in our [place] office on 5/17. Goal weight of 229 lbs  - Patient was instructed on what medications to stop prior to surgery. - Follow-up visit in 2 weeks with Dr. Lequita Halt - Begin physical therapy following surgery - Pre-operative lab work as pre-surgical testing - Prescriptions will be provided in hospital at time of discharge  Weston Brass, PA-C Orthopedic Surgery [facility] [place]

## 2022-11-10 NOTE — Progress Notes (Signed)
COVID Vaccine received:  [x]  No []  Yes Date of any COVID positive Test in last 90 days:  PCP - Creola Corn MD Cardiologist - Yates Decamp  Chest x-ray - Chest CT 03/10/22 EPIC EKG -   Stress Test - Lexiscan 09/1318 EPIC ECHO -  Cardiac Cath -   Bowel Prep - []  No  []   Yes ______  Pacemaker / ICD device []  No []  Yes   Spinal Cord Stimulator:[]  No []  Yes       History of Sleep Apnea? []  No []  Yes   CPAP used?- []  No []  Yes    Does the patient monitor blood sugar?          []  No []  Yes  []  N/A  Patient has: []  NO Hx DM   []  Pre-DM                 []  DM1  []   DM2 Does patient have a [employer] or Dexacom? []  No []  Yes   Fasting Blood Sugar Ranges-  Checks Blood Sugar _____ times a day  GLP1 agonist / usual dose -  GLP1 instructions:  SGLT-2 inhibitors / usual dose -  SGLT-2 instructions:   Blood Thinner / Instructions: Aspirin Instructions:  Comments:   Activity level: Patient is able / unable to climb a flight of stairs without difficulty; []  No CP  []  No SOB, but would have ___   Patient can / can not perform ADLs without assistance.   Anesthesia review:   Patient denies shortness of breath, fever, cough and chest pain at PAT appointment.  Patient verbalized understanding and agreement to the Pre-Surgical Instructions that were given to them at this PAT appointment. Patient was also educated of the need to review these PAT instructions again prior to his/her surgery.I reviewed the appropriate phone numbers to call if they have any and questions or concerns.

## 2022-11-10 NOTE — Patient Instructions (Addendum)
SURGICAL WAITING ROOM VISITATION  Patients having surgery or a procedure may have no more than 2 support people in the waiting area - these visitors may rotate.    Children under the age of 47 must have an adult with them who is not the patient.  Due to an increase in RSV and influenza rates and associated hospitalizations, children ages 30 and under may not visit patients in [facility] hospitals.  If the patient needs to stay at the hospital during part of their recovery, the visitor guidelines for inpatient rooms apply. Pre-op nurse will coordinate an appropriate time for 1 support person to accompany patient in pre-op.  This support person may not rotate.    Please refer to the [facility] website for the visitor guidelines for Inpatients (after your surgery is over and you are in a regular room).    Your procedure is scheduled on: 11/22/22   Report to [facility] Main Entrance    Report to admitting at 5:50 AM   Call this number if you have problems the morning of surgery [phone]   Do not eat food :After Midnight.   After Midnight you may have the following liquids until 5:20 AM DAY OF SURGERY  Water Non-Citrus Juices (without pulp, NO RED-Apple, White grape, White cranberry) Black Coffee (NO MILK/CREAM OR CREAMERS, sugar ok)  Clear Tea (NO MILK/CREAM OR CREAMERS, sugar ok) regular and decaf                             Plain Jell-O (NO RED)                                           Fruit ices (not with fruit pulp, NO RED)                                     Popsicles (NO RED)                                                               Sports drinks like Gatorade (NO RED)                The day of surgery:  Drink ONE (1) Pre-Surgery Clear Ensure 5:20 AM the morning of surgery. Drink in one sitting. Do not sip.  This drink was given to you during your hospital  pre-op appointment visit. Nothing else to drink after completing the  Pre-Surgery Clear Ensure.           If you have questions, please contact your surgeon's office.   FOLLOW BOWEL PREP AND ANY ADDITIONAL PRE OP INSTRUCTIONS YOU RECEIVED FROM YOUR SURGEON'S OFFICE!!!     Oral Hygiene is also important to reduce your risk of infection.                                    Remember - BRUSH YOUR TEETH THE MORNING OF SURGERY WITH YOUR REGULAR TOOTHPASTE  DENTURES WILL BE REMOVED  PRIOR TO SURGERY PLEASE DO NOT APPLY "Poly grip" OR ADHESIVES!!!   Take these medicines the morning of surgery with A SIP OF WATER: Alprazolam, Anatrozole, Atorvastatin, Levothyroxine                              You may not have any metal on your body including hair pins, jewelry, and body piercing             Do not wear make-up, lotions, powders, perfumes, or deodorant  Do not wear nail polish including gel and S&S, artificial/acrylic nails, or any other type of covering on natural nails including finger and toenails. If you have artificial nails, gel coating, etc. that needs to be removed by a nail salon please have this removed prior to surgery or surgery may need to be canceled/ delayed if the surgeon/ anesthesia feels like they are unable to be safely monitored.   Do not shave  48 hours prior to surgery.    Do not bring valuables to the hospital. [place] IS NOT             RESPONSIBLE   FOR VALUABLES.   Contacts, glasses, dentures or bridgework may not be worn into surgery.   Bring small overnight bag day of surgery.   DO NOT BRING YOUR HOME MEDICATIONS TO THE HOSPITAL. PHARMACY WILL DISPENSE MEDICATIONS LISTED ON YOUR MEDICATION LIST TO YOU DURING YOUR ADMISSION IN THE HOSPITAL!   Special Instructions: Bring a copy of your healthcare power of attorney and living will documents the day of surgery if you haven't scanned them before.              Please read over the following fact sheets you were given: IF YOU HAVE QUESTIONS ABOUT YOUR PRE-OP INSTRUCTIONS PLEASE CALL [name]    If you  received a COVID test during your pre-op visit  it is requested that you wear a mask when out in public, stay away from anyone that may not be feeling well and notify your surgeon if you develop symptoms. If you test positive for Covid or have been in contact with anyone that has tested positive in the last 10 days please notify you surgeon.      Incentive Spirometer  An incentive spirometer is a tool that can help keep your lungs clear and active. This tool measures how well you are filling your lungs with each breath. Taking long deep breaths may help reverse or decrease the chance of developing breathing (pulmonary) problems (especially infection) following: A long period of time when you are unable to move or be active. BEFORE THE PROCEDURE  If the spirometer includes an indicator to show your best effort, your nurse or respiratory therapist will set it to a desired goal. If possible, sit up straight or lean slightly forward. Try not to slouch. Hold the incentive spirometer in an upright position. INSTRUCTIONS FOR USE  Sit on the edge of your bed if possible, or sit up as far as you can in bed or on a chair. Hold the incentive spirometer in an upright position. Breathe out normally. Place the mouthpiece in your mouth and seal your lips tightly around it. Breathe in slowly and as deeply as possible, raising the piston or the ball toward the top of the column. Hold your breath for 3-5 seconds or for as long as possible. Allow the piston or ball to fall to the bottom  of the column. Remove the mouthpiece from your mouth and breathe out normally. Rest for a few seconds and repeat Steps 1 through 7 at least 10 times every 1-2 hours when you are awake. Take your time and take a few normal breaths between deep breaths. The spirometer may include an indicator to show your best effort. Use the indicator as a goal to work toward during each repetition. After each set of 10 deep breaths, practice  coughing to be sure your lungs are clear. If you have an incision (the cut made at the time of surgery), support your incision when coughing by placing a pillow or rolled up towels firmly against it. Once you are able to get out of bed, walk around indoors and cough well. You may stop using the incentive spirometer when instructed by your caregiver.  RISKS AND COMPLICATIONS Take your time so you do not get dizzy or light-headed. If you are in pain, you may need to take or ask for pain medication before doing incentive spirometry. It is harder to take a deep breath if you are having pain. AFTER USE Rest and breathe slowly and easily. It can be helpful to keep track of a log of your progress. Your caregiver can provide you with a simple table to help with this. If you are using the spirometer at home, follow these instructions: SEEK MEDICAL CARE IF:  You are having difficultly using the spirometer. You have trouble using the spirometer as often as instructed. Your pain medication is not giving enough relief while using the spirometer. You develop fever of 100.5 F (38.1 C) or higher. SEEK IMMEDIATE MEDICAL CARE IF:  You cough up bloody sputum that had not been present before. You develop fever of 102 F (38.9 C) or greater. You develop worsening pain at or near the incision site. MAKE SURE YOU:  Understand these instructions. Will watch your condition. Will get help right away if you are not doing well or get worse. Document Released: 11/01/2006 Document Revised: 09/13/2011 Document Reviewed: 01/02/2007 [facility] Patient Information 2014 [place], [place].   ________________________________________________________________________

## 2022-11-11 NOTE — Progress Notes (Addendum)
COVID Vaccine Completed: yes  Date of COVID positive in last 90 days: no  PCP - Creola Corn, MD Cardiologist -  Pulmonologist- Durel Salts, MD LOV 05/13/22 for SOB  Medical  clearance by Creola Corn 08/26/22 on chart  Chest x-ray - CT 03/11/22 Epic EKG - n/a Stress Test - 09/15/18 Epic ECHO - yes 2 years ago per pt, has has nodule Cardiac Cath - n/a Pacemaker/ICD device last checked: n/a Spinal Cord Stimulator: n/a  Bowel Prep - no  Sleep Study - n/a CPAP -   Fasting Blood Sugar - n/a Checks Blood Sugar _____ times a day  Last dose of GLP1 agonist-  N/A GLP1 instructions:  N/A   Last dose of SGLT-2 inhibitors-  N/A SGLT-2 instructions: N/A   Blood Thinner Instructions:  n/a Aspirin Instructions: Last Dose:  Activity level: Can go up a flight of stairs and perform activities of daily living without stopping and without symptoms of chest pain or shortness of breath.   Anesthesia review:   Patient denies shortness of breath, fever, cough and chest pain at PAT appointment  Patient verbalized understanding of instructions that were given to them at the PAT appointment. Patient was also instructed that they will need to review over the PAT instructions again at home before surgery.

## 2022-11-12 ENCOUNTER — Encounter (HOSPITAL_COMMUNITY)
Admission: RE | Admit: 2022-11-12 | Discharge: 2022-11-12 | Disposition: A | Discharge status: Home / Self Care | Payer: BC Managed Care – PPO | Source: Ambulatory Visit | Attending: Orthopedic Surgery | Admitting: Orthopedic Surgery

## 2022-11-12 ENCOUNTER — Encounter (HOSPITAL_COMMUNITY): Payer: Self-pay

## 2022-11-12 ENCOUNTER — Other Ambulatory Visit: Payer: Self-pay

## 2022-11-12 VITALS — BP 153/89 | HR 62 | Temp 98.1°F | Resp 16 | Ht 64.0 in | Wt 231.5 lb

## 2022-11-12 DIAGNOSIS — Z01818 Encounter for other preprocedural examination: Secondary | ICD-10-CM | POA: Diagnosis not present

## 2022-11-12 LAB — CBC
HCT: 43.2 % (ref 36.0–46.0)
Hemoglobin: 14.1 g/dL (ref 12.0–15.0)
MCH: 29.6 pg (ref 26.0–34.0)
MCHC: 32.6 g/dL (ref 30.0–36.0)
MCV: 90.6 fL (ref 80.0–100.0)
Platelets: 299 10*3/uL (ref 150–400)
RBC: 4.77 MIL/uL (ref 3.87–5.11)
RDW: 13 % (ref 11.5–15.5)
WBC: 11.3 10*3/uL — ABNORMAL HIGH (ref 4.0–10.5)
nRBC: 0 % (ref 0.0–0.2)

## 2022-11-12 LAB — SURGICAL PCR SCREEN
MRSA, PCR: NEGATIVE
Staphylococcus aureus: NEGATIVE

## 2022-11-21 NOTE — Anesthesia Preprocedure Evaluation (Signed)
Anesthesia Evaluation  Patient identified by MRN, date of birth, ID band Patient awake    Reviewed: Allergy & Precautions, H&P , NPO status , Patient's Chart, lab work & pertinent test results  History of Anesthesia Complications (+) PONV and history of anesthetic complications  Airway Mallampati: II  TM Distance: >3 FB Neck ROM: Full    Dental  (+) Dental Advisory Given, Teeth Intact   Pulmonary former smoker Quit smoking 2010, 9 pack year history   Pulmonary exam normal breath sounds clear to auscultation       Cardiovascular negative cardio ROS Normal cardiovascular exam Rhythm:Regular Rate:Normal  Exercise sestamibi stress test 09/15/2018:  1. The patient performed treadmill exercise using Bruce protocol, completing 5:00 minutes. The patient completed an estimated workload of 6.4 METS, reaching 86% of the maximum predicted heart rate. Exercise capacity was low. Resting hypertension 144/96 mmHg, with exaggerated exercise response seen. Peak exercise BP was 244/98 mmHg. Stress symptoms included dyspnea. No ischemic changes seen on stress electrocardiogram.  2. The overall quality of the study is good. There is no evidence of abnormal lung activity. Stress and rest SPECT images demonstrate homogeneous tracer distribution throughout the myocardium. Gated SPECT imaging reveals normal myocardial thickening and wall motion. The left ventricular ejection fraction was normal (73%).   3. Low risk study.     Neuro/Psych  PSYCHIATRIC DISORDERS Anxiety     negative neurological ROS     GI/Hepatic Neg liver ROS,GERD  Controlled,,  Endo/Other  Hypothyroidism  Morbid obesity  Renal/GU negative Renal ROS     Musculoskeletal  (+) Arthritis , Osteoarthritis,    Abdominal  (+) + obese  Peds  Hematology negative hematology ROS (+)   Anesthesia Other Findings L breast ca; hx R breast ca s/p radiation to right breast in 2012   Reproductive/Obstetrics                             Anesthesia Physical Anesthesia Plan  ASA: 3  Anesthesia Plan: Spinal   Post-op Pain Management: Ofirmev IV (intra-op)* and Regional block*   Induction: Intravenous  PONV Risk Score and Plan: 4 or greater and Ondansetron, Dexamethasone, Midazolam, Treatment may vary due to age or medical condition, Propofol infusion and TIVA  Airway Management Planned: Simple Face Mask  Additional Equipment: None  Intra-op Plan:   Post-operative Plan:   Informed Consent: I have reviewed the patients History and Physical, chart, labs and discussed the procedure including the risks, benefits and alternatives for the proposed anesthesia with the patient or authorized representative who has indicated his/her understanding and acceptance.     Dental advisory given  Plan Discussed with: CRNA  Anesthesia Plan Comments:         Anesthesia Quick Evaluation

## 2022-11-22 ENCOUNTER — Observation Stay (HOSPITAL_COMMUNITY)
Admission: RE | Admit: 2022-11-22 | Discharge: 2022-11-23 | Disposition: A | Discharge status: Home / Self Care | Payer: BC Managed Care – PPO | Attending: Orthopedic Surgery | Admitting: Orthopedic Surgery

## 2022-11-22 ENCOUNTER — Ambulatory Visit (HOSPITAL_COMMUNITY): Payer: BC Managed Care – PPO | Admitting: Physician Assistant

## 2022-11-22 ENCOUNTER — Other Ambulatory Visit: Payer: Self-pay

## 2022-11-22 ENCOUNTER — Encounter (HOSPITAL_COMMUNITY): Payer: Self-pay | Admitting: Orthopedic Surgery

## 2022-11-22 ENCOUNTER — Ambulatory Visit (HOSPITAL_COMMUNITY): Payer: BC Managed Care – PPO | Admitting: Anesthesiology

## 2022-11-22 ENCOUNTER — Encounter (HOSPITAL_COMMUNITY)
Admission: RE | Disposition: A | Discharge status: Home / Self Care | Payer: Self-pay | Source: Home / Self Care | Attending: Orthopedic Surgery

## 2022-11-22 DIAGNOSIS — Z87891 Personal history of nicotine dependence: Secondary | ICD-10-CM | POA: Diagnosis not present

## 2022-11-22 DIAGNOSIS — E039 Hypothyroidism, unspecified: Secondary | ICD-10-CM | POA: Diagnosis not present

## 2022-11-22 DIAGNOSIS — Z853 Personal history of malignant neoplasm of breast: Secondary | ICD-10-CM | POA: Insufficient documentation

## 2022-11-22 DIAGNOSIS — M1711 Unilateral primary osteoarthritis, right knee: Secondary | ICD-10-CM | POA: Diagnosis not present

## 2022-11-22 DIAGNOSIS — G8918 Other acute postprocedural pain: Secondary | ICD-10-CM | POA: Diagnosis not present

## 2022-11-22 DIAGNOSIS — M179 Osteoarthritis of knee, unspecified: Secondary | ICD-10-CM

## 2022-11-22 DIAGNOSIS — Z79899 Other long term (current) drug therapy: Secondary | ICD-10-CM | POA: Diagnosis not present

## 2022-11-22 DIAGNOSIS — Z6839 Body mass index (BMI) 39.0-39.9, adult: Secondary | ICD-10-CM | POA: Diagnosis not present

## 2022-11-22 HISTORY — PX: TOTAL KNEE ARTHROPLASTY: SHX125

## 2022-11-22 LAB — POCT I-STAT, CHEM 8
BUN: 11 mg/dL (ref 8–23)
Calcium, Ion: 1.12 mmol/L — ABNORMAL LOW (ref 1.15–1.40)
Chloride: 106 mmol/L (ref 98–111)
Creatinine, Ser: 0.7 mg/dL (ref 0.44–1.00)
Glucose, Bld: 93 mg/dL (ref 70–99)
HCT: 36 % (ref 36.0–46.0)
Hemoglobin: 12.2 g/dL (ref 12.0–15.0)
Potassium: 3.6 mmol/L (ref 3.5–5.1)
Sodium: 139 mmol/L (ref 135–145)
TCO2: 23 mmol/L (ref 22–32)

## 2022-11-22 SURGERY — ARTHROPLASTY, KNEE, TOTAL
Anesthesia: Spinal | Site: Knee | Laterality: Right

## 2022-11-22 MED ORDER — PROPOFOL 10 MG/ML IV BOLUS
INTRAVENOUS | Status: DC | PRN
  Administered 2022-11-22: 10 mg via INTRAVENOUS
  Administered 2022-11-22 (×2): 20 mg via INTRAVENOUS
  Administered 2022-11-22: 30 mg via INTRAVENOUS
  Administered 2022-11-22: 10 mg via INTRAVENOUS

## 2022-11-22 MED ORDER — HYDROMORPHONE HCL 1 MG/ML IJ SOLN: 0.5000 mg | INTRAMUSCULAR | Status: DC | PRN

## 2022-11-22 MED ORDER — 0.9 % SODIUM CHLORIDE (POUR BTL) OPTIME
TOPICAL | Status: DC | PRN
  Administered 2022-11-22: 1000 mL

## 2022-11-22 MED ORDER — FLEET ENEMA 7-19 GM/118ML RE ENEM: 1.0000 | ENEMA | Freq: Once | RECTAL | Status: DC | PRN

## 2022-11-22 MED ORDER — ATORVASTATIN CALCIUM 10 MG PO TABS
10.0000 mg | ORAL_TABLET | Freq: Every day | ORAL | Status: DC
  Administered 2022-11-23: 10 mg via ORAL
  Filled 2022-11-22: qty 1

## 2022-11-22 MED ORDER — STERILE WATER FOR IRRIGATION IR SOLN
Status: DC | PRN
  Administered 2022-11-22: 2000 mL

## 2022-11-22 MED ORDER — PROMETHAZINE HCL 25 MG/ML IJ SOLN: 6.2500 mg | INTRAMUSCULAR | Status: DC | PRN

## 2022-11-22 MED ORDER — DEXAMETHASONE SODIUM PHOSPHATE 10 MG/ML IJ SOLN
8.0000 mg | Freq: Once | INTRAMUSCULAR | Status: AC
  Administered 2022-11-22: 8 mg via INTRAVENOUS

## 2022-11-22 MED ORDER — HYDROMORPHONE HCL 1 MG/ML IJ SOLN: 0.2500 mg | INTRAMUSCULAR | Status: DC | PRN

## 2022-11-22 MED ORDER — BUPIVACAINE LIPOSOME 1.3 % IJ SUSP
INTRAMUSCULAR | Status: AC
  Filled 2022-11-22: qty 20

## 2022-11-22 MED ORDER — ONDANSETRON HCL 4 MG/2ML IJ SOLN
INTRAMUSCULAR | Status: AC
  Filled 2022-11-22: qty 2

## 2022-11-22 MED ORDER — METHOCARBAMOL 500 MG IVPB - SIMPLE MED: 500.0000 mg | Freq: Four times a day (QID) | INTRAVENOUS | Status: DC | PRN

## 2022-11-22 MED ORDER — METOCLOPRAMIDE HCL 5 MG PO TABS: 5.0000 mg | ORAL_TABLET | Freq: Three times a day (TID) | ORAL | Status: DC | PRN

## 2022-11-22 MED ORDER — EPHEDRINE SULFATE-NACL 50-0.9 MG/10ML-% IV SOSY
PREFILLED_SYRINGE | INTRAVENOUS | Status: DC | PRN
  Administered 2022-11-22 (×2): 5 mg via INTRAVENOUS

## 2022-11-22 MED ORDER — PROPOFOL 1000 MG/100ML IV EMUL
INTRAVENOUS | Status: AC
  Filled 2022-11-22: qty 100

## 2022-11-22 MED ORDER — ROPIVACAINE HCL 5 MG/ML IJ SOLN
INTRAMUSCULAR | Status: DC | PRN
  Administered 2022-11-22: 30 mL via PERINEURAL

## 2022-11-22 MED ORDER — DIPHENHYDRAMINE HCL 12.5 MG/5ML PO ELIX: 12.5000 mg | ORAL_SOLUTION | ORAL | Status: DC | PRN

## 2022-11-22 MED ORDER — ACETAMINOPHEN 10 MG/ML IV SOLN
1000.0000 mg | Freq: Four times a day (QID) | INTRAVENOUS | Status: DC
  Administered 2022-11-22: 1000 mg via INTRAVENOUS
  Filled 2022-11-22: qty 100

## 2022-11-22 MED ORDER — MINOCYCLINE HCL 100 MG PO CAPS: 100.0000 mg | ORAL_CAPSULE | Freq: Every day | ORAL | Status: DC | PRN

## 2022-11-22 MED ORDER — EPHEDRINE 5 MG/ML INJ
INTRAVENOUS | Status: AC
  Filled 2022-11-22: qty 5

## 2022-11-22 MED ORDER — METOCLOPRAMIDE HCL 5 MG/ML IJ SOLN: 5.0000 mg | Freq: Three times a day (TID) | INTRAMUSCULAR | Status: DC | PRN

## 2022-11-22 MED ORDER — SODIUM CHLORIDE 0.9 % IR SOLN
Status: DC | PRN
  Administered 2022-11-22: 1000 mL

## 2022-11-22 MED ORDER — ORAL CARE MOUTH RINSE: 15.0000 mL | Freq: Once | OROMUCOSAL | Status: AC

## 2022-11-22 MED ORDER — PHENOL 1.4 % MT LIQD: 1.0000 | OROMUCOSAL | Status: DC | PRN

## 2022-11-22 MED ORDER — ONDANSETRON HCL 4 MG/2ML IJ SOLN
INTRAMUSCULAR | Status: DC | PRN
  Administered 2022-11-22: 4 mg via INTRAVENOUS

## 2022-11-22 MED ORDER — MEPERIDINE HCL 50 MG/ML IJ SOLN: 6.2500 mg | INTRAMUSCULAR | Status: DC | PRN

## 2022-11-22 MED ORDER — LACTATED RINGERS IV SOLN: INTRAVENOUS | Status: DC

## 2022-11-22 MED ORDER — PHENYLEPHRINE 80 MCG/ML (10ML) SYRINGE FOR IV PUSH (FOR BLOOD PRESSURE SUPPORT)
PREFILLED_SYRINGE | INTRAVENOUS | Status: AC
  Filled 2022-11-22: qty 10

## 2022-11-22 MED ORDER — BISACODYL 10 MG RE SUPP: 10.0000 mg | Freq: Every day | RECTAL | Status: DC | PRN

## 2022-11-22 MED ORDER — POVIDONE-IODINE 10 % EX SWAB: 2.0000 | Freq: Once | CUTANEOUS | Status: DC

## 2022-11-22 MED ORDER — ACETAMINOPHEN 500 MG PO TABS
1000.0000 mg | ORAL_TABLET | Freq: Four times a day (QID) | ORAL | Status: AC
  Administered 2022-11-22 – 2022-11-23 (×4): 1000 mg via ORAL
  Filled 2022-11-22 (×4): qty 2

## 2022-11-22 MED ORDER — GABAPENTIN 300 MG PO CAPS
300.0000 mg | ORAL_CAPSULE | Freq: Three times a day (TID) | ORAL | Status: DC
  Administered 2022-11-22: 300 mg via ORAL
  Filled 2022-11-22 (×2): qty 1

## 2022-11-22 MED ORDER — MENTHOL 3 MG MT LOZG: 1.0000 | LOZENGE | OROMUCOSAL | Status: DC | PRN

## 2022-11-22 MED ORDER — DEXAMETHASONE SODIUM PHOSPHATE 10 MG/ML IJ SOLN
10.0000 mg | Freq: Once | INTRAMUSCULAR | Status: AC
  Administered 2022-11-23: 10 mg via INTRAVENOUS
  Filled 2022-11-22: qty 1

## 2022-11-22 MED ORDER — PROPOFOL 500 MG/50ML IV EMUL
INTRAVENOUS | Status: DC | PRN
  Administered 2022-11-22: 50 ug/kg/min via INTRAVENOUS
  Administered 2022-11-22: 100 ug/kg/min via INTRAVENOUS

## 2022-11-22 MED ORDER — PHENYLEPHRINE 80 MCG/ML (10ML) SYRINGE FOR IV PUSH (FOR BLOOD PRESSURE SUPPORT)
PREFILLED_SYRINGE | INTRAVENOUS | Status: DC | PRN
  Administered 2022-11-22 (×2): 40 ug via INTRAVENOUS
  Administered 2022-11-22: 80 ug via INTRAVENOUS
  Administered 2022-11-22: 100 ug via INTRAVENOUS
  Administered 2022-11-22: 40 ug via INTRAVENOUS
  Administered 2022-11-22: 80 ug via INTRAVENOUS
  Administered 2022-11-22: 100 ug via INTRAVENOUS
  Administered 2022-11-22 (×2): 40 ug via INTRAVENOUS
  Administered 2022-11-22: 80 ug via INTRAVENOUS

## 2022-11-22 MED ORDER — SODIUM CHLORIDE 0.9 % IV SOLN: INTRAVENOUS | Status: DC

## 2022-11-22 MED ORDER — SODIUM CHLORIDE (PF) 0.9 % IJ SOLN
INTRAMUSCULAR | Status: AC
  Filled 2022-11-22: qty 10

## 2022-11-22 MED ORDER — BUPIVACAINE LIPOSOME 1.3 % IJ SUSP
INTRAMUSCULAR | Status: DC | PRN
  Administered 2022-11-22: 20 mL

## 2022-11-22 MED ORDER — DEXAMETHASONE SODIUM PHOSPHATE 4 MG/ML IJ SOLN
INTRAMUSCULAR | Status: DC | PRN
  Administered 2022-11-22: 5 mg via PERINEURAL

## 2022-11-22 MED ORDER — FENTANYL CITRATE PF 50 MCG/ML IJ SOSY
50.0000 ug | PREFILLED_SYRINGE | INTRAMUSCULAR | Status: DC
  Administered 2022-11-22: 50 ug via INTRAVENOUS
  Filled 2022-11-22: qty 2

## 2022-11-22 MED ORDER — SODIUM CHLORIDE (PF) 0.9 % IJ SOLN
INTRAMUSCULAR | Status: AC
  Filled 2022-11-22: qty 50

## 2022-11-22 MED ORDER — BUPIVACAINE IN DEXTROSE 0.75-8.25 % IT SOLN
INTRATHECAL | Status: DC | PRN
  Administered 2022-11-22: 1.6 mL via INTRATHECAL

## 2022-11-22 MED ORDER — OXYCODONE HCL 5 MG PO TABS
5.0000 mg | ORAL_TABLET | ORAL | Status: DC | PRN
  Administered 2022-11-22 – 2022-11-23 (×4): 10 mg via ORAL
  Administered 2022-11-23: 5 mg via ORAL
  Filled 2022-11-22 (×5): qty 2

## 2022-11-22 MED ORDER — CLONIDINE HCL (ANALGESIA) 100 MCG/ML EP SOLN
EPIDURAL | Status: DC | PRN
  Administered 2022-11-22: 80 ug

## 2022-11-22 MED ORDER — POLYETHYLENE GLYCOL 3350 17 G PO PACK: 17.0000 g | PACK | Freq: Every day | ORAL | Status: DC | PRN

## 2022-11-22 MED ORDER — MIDAZOLAM HCL 2 MG/2ML IJ SOLN
1.0000 mg | INTRAMUSCULAR | Status: DC
  Filled 2022-11-22: qty 2

## 2022-11-22 MED ORDER — CHLORHEXIDINE GLUCONATE 0.12 % MT SOLN
15.0000 mL | Freq: Once | OROMUCOSAL | Status: AC
  Administered 2022-11-22: 15 mL via OROMUCOSAL

## 2022-11-22 MED ORDER — ONDANSETRON HCL 4 MG PO TABS: 4.0000 mg | ORAL_TABLET | Freq: Four times a day (QID) | ORAL | Status: DC | PRN

## 2022-11-22 MED ORDER — SODIUM CHLORIDE (PF) 0.9 % IJ SOLN
INTRAMUSCULAR | Status: DC | PRN
  Administered 2022-11-22: 60 mL

## 2022-11-22 MED ORDER — TRAMADOL HCL 50 MG PO TABS
50.0000 mg | ORAL_TABLET | Freq: Four times a day (QID) | ORAL | Status: DC | PRN
  Administered 2022-11-22: 100 mg via ORAL
  Administered 2022-11-23: 50 mg via ORAL
  Administered 2022-11-23: 100 mg via ORAL
  Filled 2022-11-22: qty 1
  Filled 2022-11-22 (×2): qty 2

## 2022-11-22 MED ORDER — METHOCARBAMOL 500 MG PO TABS
500.0000 mg | ORAL_TABLET | Freq: Four times a day (QID) | ORAL | Status: DC | PRN
  Administered 2022-11-22 – 2022-11-23 (×4): 500 mg via ORAL
  Filled 2022-11-22 (×4): qty 1

## 2022-11-22 MED ORDER — BUPIVACAINE LIPOSOME 1.3 % IJ SUSP: 20.0000 mL | Freq: Once | INTRAMUSCULAR | Status: DC

## 2022-11-22 MED ORDER — TRANEXAMIC ACID-NACL 1000-0.7 MG/100ML-% IV SOLN
1000.0000 mg | INTRAVENOUS | Status: AC
  Administered 2022-11-22: 1000 mg via INTRAVENOUS
  Filled 2022-11-22: qty 100

## 2022-11-22 MED ORDER — AMISULPRIDE (ANTIEMETIC) 5 MG/2ML IV SOLN: 10.0000 mg | Freq: Once | INTRAVENOUS | Status: DC | PRN

## 2022-11-22 MED ORDER — DEXAMETHASONE SODIUM PHOSPHATE 10 MG/ML IJ SOLN
INTRAMUSCULAR | Status: AC
  Filled 2022-11-22: qty 1

## 2022-11-22 MED ORDER — LEVOTHYROXINE SODIUM 25 MCG PO TABS
137.0000 ug | ORAL_TABLET | Freq: Every day | ORAL | Status: DC
  Administered 2022-11-23: 137 ug via ORAL
  Filled 2022-11-22: qty 1

## 2022-11-22 MED ORDER — RIVAROXABAN 10 MG PO TABS
10.0000 mg | ORAL_TABLET | Freq: Every day | ORAL | Status: DC
  Administered 2022-11-23: 10 mg via ORAL
  Filled 2022-11-22: qty 1

## 2022-11-22 MED ORDER — ALPRAZOLAM 0.5 MG PO TABS
0.5000 mg | ORAL_TABLET | Freq: Every evening | ORAL | Status: DC | PRN
  Administered 2022-11-22: 0.5 mg via ORAL
  Filled 2022-11-22: qty 1

## 2022-11-22 MED ORDER — DOCUSATE SODIUM 100 MG PO CAPS
100.0000 mg | ORAL_CAPSULE | Freq: Two times a day (BID) | ORAL | Status: DC
  Administered 2022-11-22 – 2022-11-23 (×2): 100 mg via ORAL
  Filled 2022-11-22 (×2): qty 1

## 2022-11-22 MED ORDER — CEFAZOLIN SODIUM-DEXTROSE 2-4 GM/100ML-% IV SOLN
2.0000 g | Freq: Four times a day (QID) | INTRAVENOUS | Status: AC
  Administered 2022-11-22 (×2): 2 g via INTRAVENOUS
  Filled 2022-11-22 (×2): qty 100

## 2022-11-22 MED ORDER — CEFAZOLIN SODIUM-DEXTROSE 2-4 GM/100ML-% IV SOLN
2.0000 g | INTRAVENOUS | Status: AC
  Administered 2022-11-22: 2 g via INTRAVENOUS
  Filled 2022-11-22: qty 100

## 2022-11-22 MED ORDER — ONDANSETRON HCL 4 MG/2ML IJ SOLN: 4.0000 mg | Freq: Four times a day (QID) | INTRAMUSCULAR | Status: DC | PRN

## 2022-11-22 SURGICAL SUPPLY — 61 items
ADH SKN CLS APL DERMABOND .7 (GAUZE/BANDAGES/DRESSINGS) ×1
ATTUNE PSFEM RTSZ6 NARCEM KNEE (Femur) IMPLANT
ATTUNE PSRP INSR SZ6 8 KNEE (Insert) IMPLANT
BAG COUNTER SPONGE SURGICOUNT (BAG) IMPLANT
BAG SPEC THK2 15X12 ZIP CLS (MISCELLANEOUS)
BAG SPNG CNTER NS LX DISP (BAG) ×1
BAG ZIPLOCK 12X15 (MISCELLANEOUS) ×1 IMPLANT
BASE TIBIAL ROT PLAT SZ 5 KNEE (Knees) IMPLANT
BLADE SAG 18X100X1.27 (BLADE) ×1 IMPLANT
BLADE SAW SGTL 11.0X1.19X90.0M (BLADE) ×1 IMPLANT
BNDG CMPR 5X62 HK CLSR LF (GAUZE/BANDAGES/DRESSINGS) ×1
BNDG ELASTIC 6INX 5YD STR LF (GAUZE/BANDAGES/DRESSINGS) ×1 IMPLANT
BOWL SMART MIX CTS (DISPOSABLE) ×1 IMPLANT
BSPLAT TIB 5 CMNT ROT PLAT STR (Knees) ×1 IMPLANT
CEMENT HV SMART SET (Cement) ×2 IMPLANT
CLSR STERI-STRIP ANTIMIC 1/2X4 (GAUZE/BANDAGES/DRESSINGS) IMPLANT
COVER SURGICAL LIGHT HANDLE (MISCELLANEOUS) ×1 IMPLANT
CUFF TOURN SGL QUICK 34 (TOURNIQUET CUFF) ×1
CUFF TRNQT CYL 34X4.125X (TOURNIQUET CUFF) ×1 IMPLANT
DERMABOND ADVANCED .7 DNX12 (GAUZE/BANDAGES/DRESSINGS) ×1 IMPLANT
DRAPE INCISE IOBAN 66X45 STRL (DRAPES) ×1 IMPLANT
DRAPE U-SHAPE 47X51 STRL (DRAPES) ×1 IMPLANT
DRESSING AQUACEL AG SP 3.5X10 (GAUZE/BANDAGES/DRESSINGS) IMPLANT
DRSG AQUACEL AG ADV 3.5X10 (GAUZE/BANDAGES/DRESSINGS) ×1 IMPLANT
DRSG AQUACEL AG SP 3.5X10 (GAUZE/BANDAGES/DRESSINGS) ×1
DURAPREP 26ML APPLICATOR (WOUND CARE) ×1 IMPLANT
ELECT REM PT RETURN 15FT ADLT (MISCELLANEOUS) ×1 IMPLANT
GLOVE BIO SURGEON STRL SZ 6.5 (GLOVE) IMPLANT
GLOVE BIO SURGEON STRL SZ7.5 (GLOVE) IMPLANT
GLOVE BIO SURGEON STRL SZ8 (GLOVE) ×1 IMPLANT
GLOVE BIOGEL PI IND STRL 6.5 (GLOVE) IMPLANT
GLOVE BIOGEL PI IND STRL 7.0 (GLOVE) IMPLANT
GLOVE BIOGEL PI IND STRL 8 (GLOVE) ×1 IMPLANT
GOWN STRL REUS W/ TWL LRG LVL3 (GOWN DISPOSABLE) ×1 IMPLANT
GOWN STRL REUS W/ TWL XL LVL3 (GOWN DISPOSABLE) IMPLANT
GOWN STRL REUS W/TWL LRG LVL3 (GOWN DISPOSABLE) ×1
GOWN STRL REUS W/TWL XL LVL3 (GOWN DISPOSABLE)
HANDPIECE INTERPULSE COAX TIP (DISPOSABLE) ×1
HOLDER FOLEY CATH W/STRAP (MISCELLANEOUS) IMPLANT
IMMOBILIZER KNEE 20 (SOFTGOODS) ×1 IMPLANT
IMMOBILIZER KNEE 20 THIGH 36 (SOFTGOODS) ×1 IMPLANT
KIT TURNOVER KIT A (KITS) IMPLANT
MANIFOLD NEPTUNE II (INSTRUMENTS) ×1 IMPLANT
NS IRRIG 1000ML POUR BTL (IV SOLUTION) ×1 IMPLANT
PACK TOTAL KNEE CUSTOM (KITS) ×1 IMPLANT
PADDING CAST COTTON 6X4 STRL (CAST SUPPLIES) ×2 IMPLANT
PATELLA MEDIAL ATTUN 35MM KNEE (Knees) IMPLANT
PIN STEINMAN FIXATION KNEE (PIN) IMPLANT
PROTECTOR NERVE ULNAR (MISCELLANEOUS) ×1 IMPLANT
SET HNDPC FAN SPRY TIP SCT (DISPOSABLE) ×1 IMPLANT
SPIKE FLUID TRANSFER (MISCELLANEOUS) ×1 IMPLANT
SUT MNCRL AB 4-0 PS2 18 (SUTURE) ×1 IMPLANT
SUT STRATAFIX 0 PDS 27 VIOLET (SUTURE) ×1
SUT VIC AB 2-0 CT1 27 (SUTURE) ×4
SUT VIC AB 2-0 CT1 TAPERPNT 27 (SUTURE) ×3 IMPLANT
SUTURE STRATFX 0 PDS 27 VIOLET (SUTURE) ×1 IMPLANT
TIBIAL BASE ROT PLAT SZ 5 KNEE (Knees) ×1 IMPLANT
TRAY FOLEY MTR SLVR 16FR STAT (SET/KITS/TRAYS/PACK) ×1 IMPLANT
TUBE SUCTION HIGH CAP CLEAR NV (SUCTIONS) ×1 IMPLANT
WATER STERILE IRR 1000ML POUR (IV SOLUTION) ×2 IMPLANT
WRAP KNEE MAXI GEL POST OP (GAUZE/BANDAGES/DRESSINGS) ×1 IMPLANT

## 2022-11-22 NOTE — Evaluation (Signed)
Physical Therapy Evaluation Patient Details Name: Kristen Gibson MRN: [id number] DOB: January 11, 1956 Today's Date: 11/22/2022  History of Present Illness  67 yo female s/p R TKA on 11/22/22. PMH: breast CA  Clinical Impression  Pt is s/p TKA resulting in the deficits listed below (see PT Problem List).  Pt amb ~ 40' with min assist and RW, no incr pain.  Anticipate steady progress in acute setting  Pt will benefit from acute skilled PT to increase their independence and safety with mobility to allow discharge.         Recommendations for follow up therapy are one component of a multi-disciplinary discharge planning process, led by the attending physician.  Recommendations may be updated based on patient status, additional functional criteria and insurance authorization.  Follow Up Recommendations       Assistance Recommended at Discharge Intermittent Supervision/Assistance  Patient can return home with the following  Assistance with cooking/housework;Assist for transportation;Help with stairs or ramp for entrance    Equipment Recommendations None recommended by PT  Recommendations for Other Services       Functional Status Assessment Patient has had a recent decline in their functional status and demonstrates the ability to make significant improvements in function in a reasonable and predictable amount of time.     Precautions / Restrictions Precautions Precautions: Fall;Knee Required Braces or Orthoses: Knee Immobilizer - Left Knee Immobilizer - Left: Discontinue once straight leg raise with < 10 degree lag Restrictions Weight Bearing Restrictions: No Other Position/Activity Restrictions: WBAT      Mobility  Bed Mobility Overal bed mobility: Needs Assistance Bed Mobility: Supine to Sit     Supine to sit: Supervision     General bed mobility comments: for lines, safety    Transfers Overall transfer level: Needs assistance Equipment used: Rolling walker (2  wheels) Transfers: Sit to/from Stand Sit to Stand: Min assist           General transfer comment: cues for hand placement    Ambulation/Gait Ambulation/Gait assistance: Min assist, Min guard Gait Distance (Feet): 40 Feet Assistive device: Rolling walker (2 wheels) Gait Pattern/deviations: Step-to pattern, Decreased stance time - right       General Gait Details: cues for sequence, RW position  Stairs            Wheelchair Mobility    Modified Rankin (Stroke Patients Only)       Balance                                             Pertinent Vitals/Pain Pain Assessment Pain Assessment: 0-10 Pain Score: 4  Pain Location: knee Pain Descriptors / Indicators: Grimacing, Guarding Pain Intervention(s): Limited activity within patient's tolerance, Monitored during session, Repositioned    Home Living Family/patient expects to be discharged to:: Private residence Living Arrangements: Other relatives Available Help at Discharge: Family Type of Home: House Home Access: Stairs to enter   Secretary/administrator of Steps: 1   Home Layout: One level Home Equipment: Agricultural consultant (2 wheels);Cane - single point Additional Comments: lives with brother that has AML    Prior Function Prior Level of Function : Independent/Modified Independent                     Hand Dominance        Extremity/Trunk Assessment   Upper Extremity Assessment Upper Extremity  Assessment: Overall WFL for tasks assessed    Lower Extremity Assessment Lower Extremity Assessment: RLE deficits/detail RLE Deficits / Details: ankle WFL, knee extension and hip flexion 3/5       Communication   Communication: No difficulties  Cognition Arousal/Alertness: Awake/alert Behavior During Therapy: WFL for tasks assessed/performed Overall Cognitive Status: Within Functional Limits for tasks assessed                                          General  Comments      Exercises Total Joint Exercises Ankle Circles/Pumps: AROM, Both, 10 reps Quad Sets: Both, AROM (3x)   Assessment/Plan    PT Assessment Patient needs continued PT services  PT Problem List Decreased strength;Decreased activity tolerance;Decreased balance;Decreased mobility;Pain;Decreased knowledge of precautions;Decreased range of motion       PT Treatment Interventions DME instruction;Therapeutic exercise;Gait training;Functional mobility training;Therapeutic activities;Patient/family education;Stair training    PT Goals (Current goals can be found in the Care Plan section)  Acute Rehab PT Goals Patient Stated Goal: less knee pain PT Goal Formulation: With patient Time For Goal Achievement: 11/29/22 Potential to Achieve Goals: Good    Frequency 7X/week     Co-evaluation               AM-PAC PT "6 Clicks" Mobility  Outcome Measure Help needed turning from your back to your side while in a flat bed without using bedrails?: A Little Help needed moving from lying on your back to sitting on the side of a flat bed without using bedrails?: A Little Help needed moving to and from a bed to a chair (including a wheelchair)?: A Little Help needed standing up from a chair using your arms (e.g., wheelchair or bedside chair)?: A Little Help needed to walk in hospital room?: A Little Help needed climbing 3-5 steps with a railing? : A Lot 6 Click Score: 17    End of Session Equipment Utilized During Treatment: Gait belt Activity Tolerance: Patient tolerated treatment well Patient left: in bed;with call bell/phone within reach;with bed alarm set   PT Visit Diagnosis: Other abnormalities of gait and mobility (R26.89);Difficulty in walking, not elsewhere classified (R26.2)    Time: 1610-9604 PT Time Calculation (min) (ACUTE ONLY): 19 min   Charges:   PT Evaluation $PT Eval Low Complexity: 1 Low          Hannan Tetzlaff, PT  Acute Rehab Dept ([facility])  [phone]  11/22/2022   [facility] 11/22/2022, 2:52 PM

## 2022-11-22 NOTE — Interval H&P Note (Signed)
History and Physical Interval Note:  11/22/2022 6:32 AM  Kristen Gibson  has presented today for surgery, with the diagnosis of right knee osteoarthritis.  The various methods of treatment have been discussed with the patient and family. After consideration of risks, benefits and other options for treatment, the patient has consented to  Procedure(s): TOTAL KNEE ARTHROPLASTY (Right) as a surgical intervention.  The patient's history has been reviewed, patient examined, no change in status, stable for surgery.  I have reviewed the patient's chart and labs.  Questions were answered to the patient's satisfaction.     Homero Fellers [place]

## 2022-11-22 NOTE — Anesthesia Procedure Notes (Addendum)
Spinal  Patient location during procedure: OR Start time: 11/22/2022 8:19 AM End time: 11/22/2022 8:23 AM Reason for block: surgical anesthesia Staffing Performed: anesthesiologist  Anesthesiologist: Lewie Loron, MD Performed by: Lewie Loron, MD Authorized by: Lewie Loron, MD   Preanesthetic Checklist Completed: patient identified, IV checked, site marked, risks and benefits discussed, surgical consent, monitors and equipment checked, pre-op evaluation and timeout performed Spinal Block Patient position: sitting Prep: DuraPrep and site prepped and draped Patient monitoring: heart rate, continuous pulse ox and blood pressure Approach: right paramedian Location: L3-4 Injection technique: single-shot Needle Needle type: Spinocan  Needle gauge: 25 G Needle length: 9 cm Additional Notes Expiration date of kit checked and confirmed. Patient tolerated procedure well, without complications.

## 2022-11-22 NOTE — Anesthesia Postprocedure Evaluation (Signed)
Anesthesia Post Note  Patient: Kristen Gibson  Procedure(s) Performed: TOTAL KNEE ARTHROPLASTY (Right: Knee)     Patient location during evaluation: PACU Anesthesia Type: Spinal Level of consciousness: awake and alert Pain management: pain level controlled Vital Signs Assessment: post-procedure vital signs reviewed and stable Respiratory status: spontaneous breathing Cardiovascular status: stable Anesthetic complications: no   No notable events documented.  Last Vitals:  Vitals:   11/22/22 1136 11/22/22 1317  BP: 128/78 (!) 118/55  Pulse: (!) 59 79  Resp: 16 18  Temp: 36.5 C 36.6 C  SpO2: 100% 95%    Last Pain:  Vitals:   11/22/22 1717  TempSrc:   PainSc: 6                  Lewie Loron

## 2022-11-22 NOTE — Plan of Care (Signed)
  Problem: Education: Goal: Knowledge of the prescribed therapeutic regimen will improve Outcome: Progressing   Problem: Activity: Goal: Range of joint motion will improve Outcome: Progressing   Problem: Pain Management: Goal: Pain level will decrease with appropriate interventions Outcome: Progressing   Problem: Safety: Goal: Ability to remain free from injury will improve Outcome: Progressing   

## 2022-11-22 NOTE — Transfer of Care (Addendum)
Immediate Anesthesia Transfer of Care Note  Patient: Kristen Gibson  Procedure(s) Performed: Procedure(s): TOTAL KNEE ARTHROPLASTY (Right)  Patient Location: PACU  Anesthesia Type:MAC, Regional, and Spinal  Level of Consciousness: Patient easily awoken, sedated, comfortable, cooperative, following commands, responds to stimulation.   Airway & Oxygen Therapy: Patient spontaneously breathing, ventilating well, oxygen via simple oxygen mask.  Post-op Assessment: Report given to PACU RN, vital signs reviewed and stable   Post vital signs: Reviewed and stable.  Complications: No apparent anesthesia complications  Last Vitals:  Vitals Value Taken Time  BP 101/68 11/22/22  0954  Temp    Pulse 62 11/22/22 0953  Resp 17 11/22/22 0953  SpO2 98 % 11/22/22 0953  Vitals shown include unvalidated device data.  Last Pain:  Vitals:   11/22/22 0800  TempSrc:   PainSc: 0-No pain         Complications: No notable events documented.

## 2022-11-22 NOTE — Anesthesia Procedure Notes (Signed)
Procedure Name: MAC Date/Time: 11/22/2022 8:19 AM  Performed by: Ludwig Lean, CRNAPre-anesthesia Checklist: Patient identified, Emergency Drugs available, Suction available and Patient being monitored Patient Re-evaluated:Patient Re-evaluated prior to induction Oxygen Delivery Method: Simple face mask Preoxygenation: Pre-oxygenation with 100% oxygen Placement Confirmation: positive ETCO2 and breath sounds checked- equal and bilateral

## 2022-11-22 NOTE — Progress Notes (Signed)
Orthopedic Tech Progress Note Patient Details:  Kristen Gibson 01/07/1956 [id number]  CPM Right Knee CPM Right Knee: Off Right Knee Flexion (Degrees): 40 Right Knee Extension (Degrees): 10  Post Interventions Patient Tolerated: Well  Darleen Crocker 11/22/2022, 2:08 PM

## 2022-11-22 NOTE — Progress Notes (Signed)
Orthopedic Tech Progress Note Patient Details:  LAINEE WISHON [birth year] [id number]  CPM Right Knee CPM Right Knee: On Right Knee Flexion (Degrees): 40 Right Knee Extension (Degrees): 10  Post Interventions Patient Tolerated: Well  Darleen Crocker 11/22/2022, 10:20 AM

## 2022-11-22 NOTE — Discharge Instructions (Signed)
 Frank Aluisio, MD Total Joint Specialist [facility] [address] [place], [place] [address] [phone]  TOTAL KNEE REPLACEMENT POSTOPERATIVE DIRECTIONS    Knee Rehabilitation, Guidelines Following Surgery  Results after knee surgery are often greatly improved when you follow the exercise, range of motion and muscle strengthening exercises prescribed by your doctor. Safety measures are also important to protect the knee from further injury. If any of these exercises cause you to have increased pain or swelling in your knee joint, decrease the amount until you are comfortable again and slowly increase them. If you have problems or questions, call your caregiver or physical therapist for advice.   BLOOD CLOT PREVENTION Take a 10 mg Xarelto once a day for three weeks following surgery. Then take an 81 mg Aspirin once a day for three weeks. Then discontinue Aspirin. You may resume your vitamins/supplements once you have discontinued the Xarelto. Do not take any NSAIDs (Advil, Aleve, Ibuprofen, Meloxicam, etc.) until you have discontinued the Xarelto.   HOME CARE INSTRUCTIONS  Remove items at home which could result in a fall. This includes throw rugs or furniture in walking pathways.  ICE to the affected knee as much as tolerated. Icing helps control swelling. If the swelling is well controlled you will be more comfortable and rehab easier. Continue to use ice on the knee for pain and swelling from surgery. You may notice swelling that will progress down to the foot and ankle. This is normal after surgery. Elevate the leg when you are not up walking on it.    Continue to use the breathing machine which will help keep your temperature down. It is common for your temperature to cycle up and down following surgery, especially at night when you are not up moving around and exerting yourself. The breathing machine keeps your lungs expanded and your temperature  down. Do not place pillow under the operative knee, focus on keeping the knee straight while resting  DIET You may resume your previous home diet once you are discharged from the hospital.  DRESSING / WOUND CARE / SHOWERING Keep your bulky bandage on for 2 days. On the third post-operative day you may remove the Ace bandage and gauze. There is a waterproof adhesive bandage on your skin which will stay in place until your first follow-up appointment. Once you remove this you will not need to place another bandage You may begin showering 3 days following surgery, but do not submerge the incision under water.  ACTIVITY For the first 5 days, the key is rest and control of pain and swelling Do your home exercises twice a day starting on post-operative day 3. On the days you go to physical therapy, just do the home exercises once that day. You should rest, ice and elevate the leg for 50 minutes out of every hour. Get up and walk/stretch for 10 minutes per hour. After 5 days you can increase your activity slowly as tolerated. Walk with your walker as instructed. Use the walker until you are comfortable transitioning to a cane. Walk with the cane in the opposite hand of the operative leg. You may discontinue the cane once you are comfortable and walking steadily. Avoid periods of inactivity such as sitting longer than an hour when not asleep. This helps prevent blood clots.  You may discontinue the knee immobilizer once you are able to perform a straight leg raise while lying down. You may resume a sexual relationship in one month or   when given the OK by your doctor.  You may return to work once you are cleared by your doctor.  Do not drive a car for 6 weeks or until released by your surgeon.  Do not drive while taking narcotics.  TED HOSE STOCKINGS Wear the elastic stockings on both legs for three weeks following surgery during the day. You may remove them at night for sleeping.  WEIGHT  BEARING Weight bearing as tolerated with assist device (walker, cane, etc) as directed, use it as long as suggested by your surgeon or therapist, typically at least 4-6 weeks.  POSTOPERATIVE CONSTIPATION PROTOCOL Constipation - defined medically as fewer than three stools per week and severe constipation as less than one stool per week.  One of the most common issues patients have following surgery is constipation.  Even if you have a regular bowel pattern at home, your normal regimen is likely to be disrupted due to multiple reasons following surgery.  Combination of anesthesia, postoperative narcotics, change in appetite and fluid intake all can affect your bowels.  In order to avoid complications following surgery, here are some recommendations in order to help you during your recovery period.  Colace (docusate) - Pick up an over-the-counter form of Colace or another stool softener and take twice a day as long as you are requiring postoperative pain medications.  Take with a full glass of water daily.  If you experience loose stools or diarrhea, hold the colace until you stool forms back up. If your symptoms do not get better within 1 week or if they get worse, check with your doctor. Dulcolax (bisacodyl) - Pick up over-the-counter and take as directed by the product packaging as needed to assist with the movement of your bowels.  Take with a full glass of water.  Use this product as needed if not relieved by Colace only.  MiraLax (polyethylene glycol) - Pick up over-the-counter to have on hand. MiraLax is a solution that will increase the amount of water in your bowels to assist with bowel movements.  Take as directed and can mix with a glass of water, juice, soda, coffee, or tea. Take if you go more than two days without a movement. Do not use MiraLax more than once per day. Call your doctor if you are still constipated or irregular after using this medication for 7 days in a row.  If you continue  to have problems with postoperative constipation, please contact the office for further assistance and recommendations.  If you experience "the worst abdominal pain ever" or develop nausea or vomiting, please contact the office immediatly for further recommendations for treatment.  ITCHING If you experience itching with your medications, try taking only a single pain pill, or even half a pain pill at a time.  You can also use Benadryl over the counter for itching or also to help with sleep.   MEDICATIONS See your medication summary on the "After Visit Summary" that the nursing staff will review with you prior to discharge.  You may have some home medications which will be placed on hold until you complete the course of blood thinner medication.  It is important for you to complete the blood thinner medication as prescribed by your surgeon.  Continue your approved medications as instructed at time of discharge.  PRECAUTIONS If you experience chest pain or shortness of breath - call 911 immediately for transfer to the hospital emergency department.  If you develop a fever greater that 101 F, purulent   drainage from wound, increased redness or drainage from wound, foul odor from the wound/dressing, or calf pain - CONTACT YOUR SURGEON.                                                   FOLLOW-UP APPOINTMENTS Make sure you keep all of your appointments after your operation with your surgeon and caregivers. You should call the office at the above phone number and make an appointment for approximately two weeks after the date of your surgery or on the date instructed by your surgeon outlined in the "After Visit Summary".  RANGE OF MOTION AND STRENGTHENING EXERCISES  Rehabilitation of the knee is important following a knee injury or an operation. After just a few days of immobilization, the muscles of the thigh which control the knee become weakened and shrink (atrophy). Knee exercises are designed to build up  the tone and strength of the thigh muscles and to improve knee motion. Often times heat used for twenty to thirty minutes before working out will loosen up your tissues and help with improving the range of motion but do not use heat for the first two weeks following surgery. These exercises can be done on a training (exercise) mat, on the floor, on a table or on a bed. Use what ever works the best and is most comfortable for you Knee exercises include:  Leg Lifts - While your knee is still immobilized in a splint or cast, you can do straight leg raises. Lift the leg to 60 degrees, hold for 3 sec, and slowly lower the leg. Repeat 10-20 times 2-3 times daily. Perform this exercise against resistance later as your knee gets better.  Quad and Hamstring Sets - Tighten up the muscle on the front of the thigh (Quad) and hold for 5-10 sec. Repeat this 10-20 times hourly. Hamstring sets are done by pushing the foot backward against an object and holding for 5-10 sec. Repeat as with quad sets.  Leg Slides: Lying on your back, slowly slide your foot toward your buttocks, bending your knee up off the floor (only go as far as is comfortable). Then slowly slide your foot back down until your leg is flat on the floor again. Angel Wings: Lying on your back spread your legs to the side as far apart as you can without causing discomfort.  A rehabilitation program following serious knee injuries can speed recovery and prevent re-injury in the future due to weakened muscles. Contact your doctor or a physical therapist for more information on knee rehabilitation.   POST-OPERATIVE OPIOID TAPER INSTRUCTIONS: It is important to wean off of your opioid medication as soon as possible. If you do not need pain medication after your surgery it is ok to stop day one. Opioids include: Codeine, Hydrocodone(Norco, Vicodin), Oxycodone(Percocet, oxycontin) and hydromorphone amongst others.  Long term and even short term use of opiods can  cause: Increased pain response Dependence Constipation Depression Respiratory depression And more.  Withdrawal symptoms can include Flu like symptoms Nausea, vomiting And more Techniques to manage these symptoms Hydrate well Eat regular healthy meals Stay active Use relaxation techniques(deep breathing, meditating, yoga) Do Not substitute Alcohol to help with tapering If you have been on opioids for less than two weeks and do not have pain than it is ok to stop all together.  Plan to   wean off of opioids This plan should start within one week post op of your joint replacement. Maintain the same interval or time between taking each dose and first decrease the dose.  Cut the total daily intake of opioids by one tablet each day Next start to increase the time between doses. The last dose that should be eliminated is the evening dose.   IF YOU ARE TRANSFERRED TO A SKILLED REHAB FACILITY If the patient is transferred to a skilled rehab facility following release from the hospital, a list of the current medications will be sent to the facility for the patient to continue.  When discharged from the skilled rehab facility, please have the facility set up the patient's Home Health Physical Therapy prior to being released. Also, the skilled facility will be responsible for providing the patient with their medications at time of release from the facility to include their pain medication, the muscle relaxants, and their blood thinner medication. If the patient is still at the rehab facility at time of the two week follow up appointment, the skilled rehab facility will also need to assist the patient in arranging follow up appointment in our office and any transportation needs.  MAKE SURE YOU:  Understand these instructions.  Get help right away if you are not doing well or get worse.   DENTAL ANTIBIOTICS:  In most cases prophylactic antibiotics for Dental procdeures after total joint surgery are  not necessary.  Exceptions are as follows:  1. History of prior total joint infection  2. Severely immunocompromised (Organ Transplant, cancer chemotherapy, Rheumatoid biologic meds such as Humera)  3. Poorly controlled diabetes (A1C &gt; 8.0, blood glucose over 200)  If you have one of these conditions, contact your surgeon for an antibiotic prescription, prior to your dental procedure.    Pick up stool softner and laxative for home use following surgery while on pain medications. Do not submerge incision under water. Please use good hand washing techniques while changing dressing each day. May shower starting three days after surgery. Please use a clean towel to pat the incision dry following showers. Continue to use ice for pain and swelling after surgery. Do not use any lotions or creams on the incision until instructed by your surgeon.  Information on my medicine - XARELTO (Rivaroxaban)  Why was Xarelto prescribed for you? Xarelto was prescribed for you to reduce the risk of blood clots forming after orthopedic surgery. The medical term for these abnormal blood clots is venous thromboembolism (VTE).  What do you need to know about xarelto ? Take your Xarelto ONCE DAILY at the same time every day. You may take it either with or without food.  If you have difficulty swallowing the tablet whole, you may crush it and mix in applesauce just prior to taking your dose.  Take Xarelto exactly as prescribed by your doctor and DO NOT stop taking Xarelto without talking to the doctor who prescribed the medication.  Stopping without other VTE prevention medication to take the place of Xarelto may increase your risk of developing a clot.  After discharge, you should have regular check-up appointments with your healthcare provider that is prescribing your Xarelto.    What do you do if you miss a dose? If you miss a dose, take it as soon as you remember on the same day then  continue your regularly scheduled once daily regimen the next day. Do not take two doses of Xarelto on the same day.   Important   Safety Information A possible side effect of Xarelto is bleeding. You should call your healthcare provider right away if you experience any of the following: Bleeding from an injury or your nose that does not stop. Unusual colored urine (red or dark brown) or unusual colored stools (red or black). Unusual bruising for unknown reasons. A serious fall or if you hit your head (even if there is no bleeding).  Some medicines may interact with Xarelto and might increase your risk of bleeding while on Xarelto. To help avoid this, consult your healthcare provider or pharmacist prior to using any new prescription or non-prescription medications, including herbals, vitamins, non-steroidal anti-inflammatory drugs (NSAIDs) and supplements.  This website has more information on Xarelto: www.xarelto.com.    

## 2022-11-22 NOTE — Op Note (Signed)
OPERATIVE REPORT-TOTAL KNEE ARTHROPLASTY   Pre-operative diagnosis- Osteoarthritis  Right knee(s)  Post-operative diagnosis- Osteoarthritis Right knee(s)  Procedure-  Right  Total Knee Arthroplasty  Surgeon- Gus Rankin. Donisha Hoch, MD  Assistant- Arther Abbott, PA-C   Anesthesia-   Adductor canal block and spinal  EBL-20 mL   Drains None  Tourniquet time-  Total Tourniquet Time Documented: Thigh (Right) - 35 minutes Total: Thigh (Right) - 35 minutes     Complications- None  Condition-PACU - hemodynamically stable.   Brief Clinical Note  Kristen Gibson is a 67 y.o. year old female with end stage OA of her right knee with progressively worsening pain and dysfunction. She has constant pain, with activity and at rest and significant functional deficits with difficulties even with ADLs. She has had extensive non-op management including analgesics, injections of cortisone and viscosupplements, and home exercise program, but remains in significant pain with significant dysfunction.Radiographs show bone on bone arthritis medial and patellofemoral. She presents now for right Total Knee Arthroplasty.     Procedure in detail---   The patient is brought into the operating room and positioned supine on the operating table. After successful administration of  Adductor canal block and spinal,   a tourniquet is placed high on the  Right thigh(s) and the lower extremity is prepped and draped in the usual sterile fashion. Time out is performed by the operating team and then the  Right lower extremity is wrapped in Esmarch, knee flexed and the tourniquet inflated to 300 mmHg.       A midline incision is made with a ten blade through the subcutaneous tissue to the level of the extensor mechanism. A fresh blade is used to make a medial parapatellar arthrotomy. Soft tissue over the proximal medial tibia is subperiosteally elevated to the joint line with a knife and into the semimembranosus bursa with  a Cobb elevator. Soft tissue over the proximal lateral tibia is elevated with attention being paid to avoiding the patellar tendon on the tibial tubercle. The patella is everted, knee flexed 90 degrees and the ACL and PCL are removed. Findings are bone on bone medial and patellofemoral with large global osteophytes        The drill is used to create a starting hole in the distal femur and the canal is thoroughly irrigated with sterile saline to remove the fatty contents. The 5 degree Right  valgus alignment guide is placed into the femoral canal and the distal femoral cutting block is pinned to remove 9 mm off the distal femur. Resection is made with an oscillating saw.      The tibia is subluxed forward and the menisci are removed. The extramedullary alignment guide is placed referencing proximally at the medial aspect of the tibial tubercle and distally along the second metatarsal axis and tibial crest. The block is pinned to remove 2mm off the more deficient medial  side. Resection is made with an oscillating saw. Size 5is the most appropriate size for the tibia and the proximal tibia is prepared with the modular drill and keel punch for that size.      The femoral sizing guide is placed and size 6 is most appropriate. Rotation is marked off the epicondylar axis and confirmed by creating a rectangular flexion gap at 90 degrees. The size 6 cutting block is pinned in this rotation and the anterior, posterior and chamfer cuts are made with the oscillating saw. The intercondylar block is then placed and that cut is made.  Trial size 5 tibial component, trial size 6 narrow posterior stabilized femur and a 8  mm posterior stabilized rotating platform insert trial is placed. Full extension is achieved with excellent varus/valgus and anterior/posterior balance throughout full range of motion. The patella is everted and thickness measured to be 23  mm. Free hand resection is taken to 14 mm, a 35 template is  placed, lug holes are drilled, trial patella is placed, and it tracks normally. Osteophytes are removed off the posterior femur with the trial in place. All trials are removed and the cut bone surfaces prepared with pulsatile lavage. Cement is mixed and once ready for implantation, the size 5 tibial implant, size  6 narrow posterior stabilized femoral component, and the size 35 patella are cemented in place and the patella is held with the clamp. The trial insert is placed and the knee held in full extension. The Exparel (20 ml mixed with 60 ml saline) is injected into the extensor mechanism, posterior capsule, medial and lateral gutters and subcutaneous tissues.  All extruded cement is removed and once the cement is hard the permanent 8 mm posterior stabilized rotating platform insert is placed into the tibial tray.      The wound is copiously irrigated with saline solution and the extensor mechanism closed with # 0 Stratofix suture. The tourniquet is released for a total tourniquet time of 35  minutes. Flexion against gravity is 140 degrees and the patella tracks normally. Subcutaneous tissue is closed with 2.0 vicryl and subcuticular with running 4.0 Monocryl. The incision is cleaned and dried and steri-strips and a bulky sterile dressing are applied. The limb is placed into a knee immobilizer and the patient is awakened and transported to recovery in stable condition.      Please note that a surgical assistant was a medical necessity for this procedure in order to perform it in a safe and expeditious manner. Surgical assistant was necessary to retract the ligaments and vital neurovascular structures to prevent injury to them and also necessary for proper positioning of the limb to allow for anatomic placement of the prosthesis.   Gus Rankin Kristen Critzer, MD    11/22/2022, 9:25 AM

## 2022-11-22 NOTE — Anesthesia Procedure Notes (Signed)
Anesthesia Regional Block: Adductor canal block   Pre-Anesthetic Checklist: , timeout performed,  Correct Patient, Correct Site, Correct Laterality,  Correct Procedure, Correct Position, site marked,  Risks and benefits discussed,  Surgical consent,  Pre-op evaluation,  At surgeon's request and post-op pain management  Laterality: Lower and Right  Prep: chloraprep       Needles:  Injection technique: Single-shot  Needle Type: Stimiplex     Needle Length: 9cm  Needle Gauge: 21     Additional Needles:   Procedures:,,,, ultrasound used (permanent image in chart),,    Narrative:  Start time: 11/22/2022 7:37 AM End time: 11/22/2022 7:57 AM Injection made incrementally with aspirations every 5 mL.  Performed by: Personally  Anesthesiologist: Lewie Loron, MD  Additional Notes: BP cuff, EKG monitors applied. Sedation begun. Artery and nerve location verified with ultrasound. Anesthetic injected incrementally (5ml), slowly, and after negative aspirations under direct u/s guidance. Good fascial/perineural spread. Tolerated well.

## 2022-11-23 ENCOUNTER — Encounter (HOSPITAL_COMMUNITY): Payer: Self-pay | Admitting: Orthopedic Surgery

## 2022-11-23 DIAGNOSIS — Z853 Personal history of malignant neoplasm of breast: Secondary | ICD-10-CM | POA: Diagnosis not present

## 2022-11-23 DIAGNOSIS — M1711 Unilateral primary osteoarthritis, right knee: Secondary | ICD-10-CM | POA: Diagnosis not present

## 2022-11-23 DIAGNOSIS — E039 Hypothyroidism, unspecified: Secondary | ICD-10-CM | POA: Diagnosis not present

## 2022-11-23 DIAGNOSIS — Z87891 Personal history of nicotine dependence: Secondary | ICD-10-CM | POA: Diagnosis not present

## 2022-11-23 DIAGNOSIS — Z79899 Other long term (current) drug therapy: Secondary | ICD-10-CM | POA: Diagnosis not present

## 2022-11-23 LAB — CBC
HCT: 36 % (ref 36.0–46.0)
Hemoglobin: 11.9 g/dL — ABNORMAL LOW (ref 12.0–15.0)
MCH: 30.3 pg (ref 26.0–34.0)
MCHC: 33.1 g/dL (ref 30.0–36.0)
MCV: 91.6 fL (ref 80.0–100.0)
Platelets: 265 10*3/uL (ref 150–400)
RBC: 3.93 MIL/uL (ref 3.87–5.11)
RDW: 12.7 % (ref 11.5–15.5)
WBC: 23.7 10*3/uL — ABNORMAL HIGH (ref 4.0–10.5)
nRBC: 0 % (ref 0.0–0.2)

## 2022-11-23 LAB — BASIC METABOLIC PANEL
Anion gap: 6 (ref 5–15)
BUN: 15 mg/dL (ref 8–23)
CO2: 25 mmol/L (ref 22–32)
Calcium: 8.7 mg/dL — ABNORMAL LOW (ref 8.9–10.3)
Chloride: 104 mmol/L (ref 98–111)
Creatinine, Ser: 0.87 mg/dL (ref 0.44–1.00)
GFR, Estimated: >60 mL/min (ref >60)
Glucose, Bld: 135 mg/dL — ABNORMAL HIGH (ref 70–99)
Potassium: 4.3 mmol/L (ref 3.5–5.1)
Sodium: 135 mmol/L (ref 135–145)

## 2022-11-23 MED ORDER — TRAMADOL HCL 50 MG PO TABS: 50.0000 mg | ORAL_TABLET | Freq: Four times a day (QID) | ORAL | 0 refills | Status: AC | PRN

## 2022-11-23 MED ORDER — OXYCODONE HCL 5 MG PO TABS: 5.0000 mg | ORAL_TABLET | Freq: Four times a day (QID) | ORAL | 0 refills | Status: AC | PRN

## 2022-11-23 MED ORDER — METHOCARBAMOL 500 MG PO TABS: 500.0000 mg | ORAL_TABLET | Freq: Four times a day (QID) | ORAL | 0 refills | Status: AC | PRN

## 2022-11-23 MED ORDER — RIVAROXABAN 10 MG PO TABS: 10.0000 mg | ORAL_TABLET | Freq: Every day | ORAL | 0 refills | Status: AC

## 2022-11-23 NOTE — TOC Transition Note (Signed)
Transition of Care [facility]) - CM/SW Discharge Note   Patient Details  Name: Kristen Gibson MRN: [id number] Date of Birth: January 21, 1956  Transition of Care [facility]) CM/SW Contact:  Amada Jupiter, LCSW Phone Number: 11/23/2022, 10:22 AM   Clinical Narrative:     Met with pt who confirms she has needed DME at home.  OPPT already arranged with [facility]).  No TOC needs.    Final next level of care: OP Rehab Barriers to Discharge: No Barriers Identified   Patient Goals and CMS Choice      Discharge Placement                         Discharge Plan and Services Additional resources added to the After Visit Summary for                  DME Arranged: N/A DME Agency: NA                  Social Determinants of Health (SDOH) Interventions SDOH Screenings   Food Insecurity: No Food Insecurity (11/22/2022)  Housing: Low Risk  (11/22/2022)  Transportation Needs: No Transportation Needs (11/22/2022)  Utilities: Not At Risk (11/22/2022)  Tobacco Use: Medium Risk (11/22/2022)     Readmission Risk Interventions     No data to display

## 2022-11-23 NOTE — Progress Notes (Signed)
Physical Therapy Treatment Patient Details Name: Kristen Gibson MRN: [id number] DOB: 1955/10/01 Today's Date: 11/23/2022   History of Present Illness 67 yo female s/p R TKA on 11/22/22. PMH: breast CA    PT Comments    Pt is progressing well, very motivated. Knee ROM improving, incr gait distance. Will see again for stair training and pt hopeful to d/c this pm   Recommendations for follow up therapy are one component of a multi-disciplinary discharge planning process, led by the attending physician.  Recommendations may be updated based on patient status, additional functional criteria and insurance authorization.  Follow Up Recommendations       Assistance Recommended at Discharge Intermittent Supervision/Assistance  Patient can return home with the following Assistance with cooking/housework;Assist for transportation;Help with stairs or ramp for entrance   Equipment Recommendations  None recommended by PT    Recommendations for Other Services       Precautions / Restrictions Precautions Precautions: Fall;Knee Required Braces or Orthoses: Knee Immobilizer - Left Knee Immobilizer - Left: Discontinue once straight leg raise with < 10 degree lag Restrictions Weight Bearing Restrictions: No Other Position/Activity Restrictions: WBAT     Mobility  Bed Mobility               General bed mobility comments: received in recliner    Transfers Overall transfer level: Needs assistance Equipment used: Rolling walker (2 wheels) Transfers: Sit to/from Stand Sit to Stand: Min guard           General transfer comment: cues for hand placement    Ambulation/Gait Ambulation/Gait assistance: Min guard Gait Distance (Feet): 80 Feet Assistive device: Rolling walker (2 wheels) Gait Pattern/deviations: Step-to pattern, Decreased stance time - right       General Gait Details: cues for sequence, RW position; good stability, no LOB   Stairs              Wheelchair Mobility    Modified Rankin (Stroke Patients Only)       Balance                                            Cognition Arousal/Alertness: Awake/alert Behavior During Therapy: WFL for tasks assessed/performed Overall Cognitive Status: Within Functional Limits for tasks assessed                                          Exercises Total Joint Exercises Ankle Circles/Pumps: AROM, Both, 10 reps Quad Sets: AROM, Right, 10 reps Heel Slides: AROM, AAROM, Right, 10 reps Straight Leg Raises: AROM, Right, 10 reps    General Comments        Pertinent Vitals/Pain Pain Assessment Pain Assessment: 0-10 Pain Score: 3  Pain Location: R knee Pain Descriptors / Indicators: Grimacing, Guarding Pain Intervention(s): Limited activity within patient's tolerance, Monitored during session, Premedicated before session, Repositioned    Home Living                          Prior Function            PT Goals (current goals can now be found in the care plan section) Acute Rehab PT Goals Patient Stated Goal: less knee pain PT Goal Formulation: With patient Time For Goal Achievement:  11/29/22 Potential to Achieve Goals: Good Progress towards PT goals: Progressing toward goals    Frequency    7X/week      PT Plan Current plan remains appropriate    Co-evaluation              AM-PAC PT "6 Clicks" Mobility   Outcome Measure  Help needed turning from your back to your side while in a flat bed without using bedrails?: A Little Help needed moving from lying on your back to sitting on the side of a flat bed without using bedrails?: A Little Help needed moving to and from a bed to a chair (including a wheelchair)?: A Little Help needed standing up from a chair using your arms (e.g., wheelchair or bedside chair)?: A Little Help needed to walk in hospital room?: A Little Help needed climbing 3-5 steps with a railing? : A  Little 6 Click Score: 18    End of Session Equipment Utilized During Treatment: Gait belt Activity Tolerance: Patient tolerated treatment well Patient left: in chair;with call bell/phone within reach;with chair alarm set   PT Visit Diagnosis: Other abnormalities of gait and mobility (R26.89);Difficulty in walking, not elsewhere classified (R26.2)     Time: 1610-9604 PT Time Calculation (min) (ACUTE ONLY): 23 min  Charges:  $Gait Training: 8-22 mins $Therapeutic Exercise: 8-22 mins                     Delice Bison, PT  Acute Rehab Dept [facility]) [phone]  11/23/2022    [facility] 11/23/2022, 10:55 AM

## 2022-11-23 NOTE — Progress Notes (Signed)
Subjective: 1 Day Post-Op Procedure(s) (LRB): TOTAL KNEE ARTHROPLASTY (Right) Patient seen in rounds by Dr. Lequita Halt. Patient is well, and has had no acute complaints or problems. Denies SOB or chest pain. Denies calf pain. Foley cath removed this AM. Patient reports pain as moderate. Worked with physical therapy yesterday and ambulated 40'. We will continue physical therapy today.  Objective: Vital signs in last 24 hours: Temp:  [97.6 F (36.4 C)-98.2 F (36.8 C)] 98.1 F (36.7 C) (05/21 4098) Pulse Rate:  [58-79] 64 (05/21 0638) Resp:  [14-22] 16 (05/21 0638) BP: (101-183)/(55-95) 124/74 (05/21 0638) SpO2:  [94 %-100 %] 94 % (05/21 1191)  Intake/Output from previous day:  Intake/Output Summary (Last 24 hours) at 11/23/2022 0727 Last data filed at 11/23/2022 0600 Gross per 24 hour  Intake 3161.02 ml  Output 1730 ml  Net 1431.02 ml     Intake/Output this shift: No intake/output data recorded.  Labs: Recent Labs    11/22/22 0749 11/23/22 0344  HGB 12.2 11.9*   Recent Labs    11/22/22 0749 11/23/22 0344  WBC  --  23.7*  RBC  --  3.93  HCT 36.0 36.0  PLT  --  265   Recent Labs    11/22/22 0749 11/23/22 0344  NA 139 135  K 3.6 4.3  CL 106 104  CO2  --  25  BUN 11 15  CREATININE 0.70 0.87  GLUCOSE 93 135*  CALCIUM  --  8.7*   No results for input(s): "LABPT", "INR" in the last 72 hours.  Exam: General - Patient is Alert and Oriented Extremity - Neurologically intact Neurovascular intact Sensation intact distally Dorsiflexion/Plantar flexion intact Dressing - dressing C/D/I Motor Function - intact, moving foot and toes well on exam.  Past Medical History:  Diagnosis Date   Allergy    Anxiety    no medications for anxiety   Arthritis    Breast cancer (HCC)    2012   Cancer (HCC) 05/2011   right breast   GERD (gastroesophageal reflux disease)    Hypothyroid    Personal history of radiation therapy    2012 right breast   Personal history of  radiation therapy    2021 left breast   PONV (postoperative nausea and vomiting)    with first breast surgery   Psoriasis    S/P radiation therapy 06/17/2011 - 08/04/2011   Right Breast - 45 Gy/25 fractions whole breast, and 16 Gy/8 fractions for boost    Assessment/Plan: 1 Day Post-Op Procedure(s) (LRB): TOTAL KNEE ARTHROPLASTY (Right) Principal Problem:   OA (osteoarthritis) of knee Active Problems:   Primary osteoarthritis of right knee  Estimated body mass index is 39.31 kg/m as calculated from the following:   Height as of this encounter: 5\' 4"  (1.626 m).   Weight as of this encounter: 103.9 kg. Advance diet Up with therapy D/C IV fluids   Patient's anticipated LOS is less than 2 midnights, meeting these requirements: - Younger than 59 - Lives within 1 hour of care - Has a competent adult at home to recover with post-op - NO history of  - Chronic pain requiring opiods  - Diabetes  - Coronary Artery Disease  - Heart failure  - Heart attack  - Stroke  - DVT/VTE  - Cardiac arrhythmia  - Respiratory Failure/COPD  - Renal failure  - Anemia  - Advanced Liver disease  DVT Prophylaxis - Xarelto Weight bearing as tolerated.  Continue physical therapy. Expected discharge home today  pending progress and if meeting patient goals. Scheduled for [facility] at [facility]. Follow-up in clinic in 2 weeks.  Alfonzo Feller, PA-C Orthopedic Surgery 11/23/2022, 7:27 AM

## 2022-11-23 NOTE — Progress Notes (Signed)
PT TX NOTE  11/23/22 1300  PT Visit Information  Last PT Received On 11/23/22  Assistance Needed   Pt making excellent progress, meeting goals; feels ready to d/c today; brother assisting prn  History of Present Illness 67 yo female s/p R TKA on 11/22/22. PMH: breast CA  Subjective Data  Patient Stated Goal less knee pain  Precautions  Precautions Fall;Knee  Required Braces or Orthoses Knee Immobilizer - Left  Knee Immobilizer - Left Discontinue once straight leg raise with < 10 degree lag  Restrictions  Weight Bearing Restrictions No  Other Position/Activity Restrictions WBAT  Pain Assessment  Pain Assessment 0-10  Pain Score 3  Pain Location R knee  Pain Descriptors / Indicators Grimacing;Guarding  Pain Intervention(s) Limited activity within patient's tolerance;Monitored during session;Premedicated before session;Repositioned  Cognition  Arousal/Alertness Awake/alert  Behavior During Therapy WFL for tasks assessed/performed  Overall Cognitive Status Within Functional Limits for tasks assessed  Bed Mobility  General bed mobility comments received in recliner  Transfers  Overall transfer level Needs assistance  Equipment used Rolling walker (2 wheels)  Transfers Sit to/from Stand  Sit to Stand Supervision  General transfer comment cues for hand placement and overall safety  Ambulation/Gait  Ambulation/Gait assistance Min guard;Supervision  Gait Distance (Feet) 60 Feet  Assistive device Rolling walker (2 wheels)  Gait Pattern/deviations Step-to pattern;Decreased stance time - right  General Gait Details cues for sequence, RW position; good stability, no LOB; progression to step through  Stairs Yes  Stairs assistance Min guard  Stair Management No rails;Step to pattern;Forwards;With walker  Number of Stairs 1 (x2)  General stair comments verbal cues for sequence and RW position; good stability, no LOB or knee buckling  PT - End of Session  Equipment Utilized During  Treatment Gait belt  Activity Tolerance Patient tolerated treatment well  Patient left in chair;with call bell/phone within reach;with chair alarm set  Nurse Communication Mobility status   PT - Assessment/Plan  PT Plan Current plan remains appropriate  PT Visit Diagnosis Other abnormalities of gait and mobility (R26.89);Difficulty in walking, not elsewhere classified (R26.2)  PT Frequency (ACUTE ONLY) 7X/week  Follow Up Recommendations Follow physician's recommendations for discharge plan and follow up therapies  Assistance recommended at discharge Intermittent Supervision/Assistance  Patient can return home with the following Assistance with cooking/housework;Assist for transportation;Help with stairs or ramp for entrance  PT equipment None recommended by PT  AM-PAC PT "6 Clicks" Mobility Outcome Measure (Version 2)  Help needed turning from your back to your side while in a flat bed without using bedrails? 3  Help needed moving from lying on your back to sitting on the side of a flat bed without using bedrails? 3  Help needed moving to and from a bed to a chair (including a wheelchair)? 3  Help needed standing up from a chair using your arms (e.g., wheelchair or bedside chair)? 3  Help needed to walk in hospital room? 3  Help needed climbing 3-5 steps with a railing?  3  6 Click Score 18  Consider Recommendation of Discharge To: Home with [facility]  Acute Rehab PT Goals  PT Goal Formulation With patient  Time For Goal Achievement 11/29/22  Potential to Achieve Goals Good  PT Time Calculation  PT Start Time (ACUTE ONLY) 1334  PT Stop Time (ACUTE ONLY) 1350  PT Time Calculation (min) (ACUTE ONLY) 16 min  PT General Charges  $$ ACUTE PT VISIT 1 Visit  PT Treatments  $Gait Training 8-22 mins

## 2022-11-24 NOTE — Discharge Summary (Signed)
Physician Discharge Summary   Patient ID: [name] MRN: [id number] DOB/AGE: 1955-08-13 67 y.o.  Admit date: 11/22/2022 Discharge date: 11/23/2022  Primary Diagnosis: Osteoarthritis right knee   Admission Diagnoses:  Past Medical History:  Diagnosis Date   Allergy    Anxiety    no medications for anxiety   Arthritis    Breast cancer (HCC)    2012   Cancer [facility]) 05/2011   right breast   GERD (gastroesophageal reflux disease)    Hypothyroid    Personal history of radiation therapy    2012 right breast   Personal history of radiation therapy    2021 left breast   PONV (postoperative nausea and vomiting)    with first breast surgery   Psoriasis    S/P radiation therapy 06/17/2011 - 08/04/2011   Right Breast - 45 Gy/25 fractions whole breast, and 16 Gy/8 fractions for boost   Discharge Diagnoses:   Principal Problem:   OA (osteoarthritis) of knee Active Problems:   Primary osteoarthritis of right knee  Estimated body mass index is 39.31 kg/m as calculated from the following:   Height as of this encounter: 5\' 4"  (1.626 m).   Weight as of this encounter: 103.9 kg.  Procedure:  Procedure(s) (LRB): TOTAL KNEE ARTHROPLASTY (Right)   Consults: None  HPI: Kristen Gibson is a 67 y.o. year old female with end stage OA of her right knee with progressively worsening pain and dysfunction. She has constant pain, with activity and at rest and significant functional deficits with difficulties even with ADLs. She has had extensive non-op management including analgesics, injections of cortisone and viscosupplements, and home exercise program, but remains in significant pain with significant dysfunction.Radiographs show bone on bone arthritis medial and patellofemoral. She presents now for right Total Knee Arthroplasty.  Laboratory Data: Admission on 11/22/2022, Discharged on 11/23/2022  Component Date Value Ref Range Status   Sodium 11/22/2022 139  135 - 145 mmol/L Final    Potassium 11/22/2022 3.6  3.5 - 5.1 mmol/L Final   Chloride 11/22/2022 106  98 - 111 mmol/L Final   BUN 11/22/2022 11  8 - 23 mg/dL Final   Creatinine, Ser 11/22/2022 0.70  0.44 - 1.00 mg/dL Final   Glucose, Bld 40/98/1191 93  70 - 99 mg/dL Final   Glucose reference range applies only to samples taken after fasting for at least 8 hours.   Calcium, Ion 11/22/2022 1.12 (L)  1.15 - 1.40 mmol/L Final   TCO2 11/22/2022 23  22 - 32 mmol/L Final   Hemoglobin 11/22/2022 12.2  12.0 - 15.0 g/dL Final   HCT 47/82/9562 36.0  36.0 - 46.0 % Final   WBC 11/23/2022 23.7 (H)  4.0 - 10.5 K/uL Final   RBC 11/23/2022 3.93  3.87 - 5.11 MIL/uL Final   Hemoglobin 11/23/2022 11.9 (L)  12.0 - 15.0 g/dL Final   HCT 13/02/6577 36.0  36.0 - 46.0 % Final   MCV 11/23/2022 91.6  80.0 - 100.0 fL Final   MCH 11/23/2022 30.3  26.0 - 34.0 pg Final   MCHC 11/23/2022 33.1  30.0 - 36.0 g/dL Final   RDW 46/96/2952 12.7  11.5 - 15.5 % Final   Platelets 11/23/2022 265  150 - 400 K/uL Final   nRBC 11/23/2022 0.0  0.0 - 0.2 % Final   Performed at [facility], [address], [place], [place] [address]   Sodium 11/23/2022 135  135 - 145 mmol/L Final   Potassium 11/23/2022 4.3  3.5 - 5.1 mmol/L Final   Chloride 11/23/2022 104  98 - 111 mmol/L Final   CO2 11/23/2022 25  22 - 32 mmol/L Final   Glucose, Bld 11/23/2022 135 (H)  70 - 99 mg/dL Final   Glucose reference range applies only to samples taken after fasting for at least 8 hours.   BUN 11/23/2022 15  8 - 23 mg/dL Final   Creatinine, Ser 11/23/2022 0.87  0.44 - 1.00 mg/dL Final   Calcium 16/04/9603 8.7 (L)  8.9 - 10.3 mg/dL Final   GFR, Estimated 11/23/2022 >60  >60 mL/min Final   Comment: (NOTE) Calculated using the CKD-EPI Creatinine Equation (2021)    Anion gap 11/23/2022 6  5 - 15 Final   Performed at [facility], [address] [place], [place] [address]  Hospital Outpatient Visit on 11/12/2022  Component Date Value Ref Range  Status   MRSA, PCR 11/12/2022 NEGATIVE  NEGATIVE Final   Staphylococcus aureus 11/12/2022 NEGATIVE  NEGATIVE Final   Comment: (NOTE) The Xpert SA Assay (FDA approved for NASAL specimens in patients 16 years of age and older), is one component of a comprehensive surveillance program. It is not intended to diagnose infection nor to guide or monitor treatment. Performed at [facility], [address] [place], [place] [address]    WBC 11/12/2022 11.3 (H)  4.0 - 10.5 K/uL Final   RBC 11/12/2022 4.77  3.87 - 5.11 MIL/uL Final   Hemoglobin 11/12/2022 14.1  12.0 - 15.0 g/dL Final   HCT 78/29/5621 43.2  36.0 - 46.0 % Final   MCV 11/12/2022 90.6  80.0 - 100.0 fL Final   MCH 11/12/2022 29.6  26.0 - 34.0 pg Final   MCHC 11/12/2022 32.6  30.0 - 36.0 g/dL Final   RDW 30/86/5784 13.0  11.5 - 15.5 % Final   Platelets 11/12/2022 299  150 - 400 K/uL Final   nRBC 11/12/2022 0.0  0.0 - 0.2 % Final   Performed at [facility], [address] [place], [place] [address]     X-Rays:No results found.  EKG:No orders found for this or any previous visit.   Hospital Course: Kristen Gibson is a 15 y.o. who was admitted to [facility]. They were brought to the operating room on 11/22/2022 and underwent Procedure(s): TOTAL KNEE ARTHROPLASTY.  Patient tolerated the procedure well and was later transferred to the recovery room and then to the orthopaedic floor for postoperative care. They were given PO and IV analgesics for pain control following their surgery. They were given 24 hours of postoperative antibiotics of  Anti-infectives (From admission, onward)    Start     Dose/Rate Route Frequency Ordered Stop   11/22/22 1400  ceFAZolin (ANCEF) IVPB 2g/100 mL premix        2 g 200 mL/hr over 30 Minutes Intravenous Every 6 hours 11/22/22 1138 11/22/22 2020   11/22/22 1138  minocycline (MINOCIN) capsule 100 mg  Status:  Discontinued        100 mg Oral Daily PRN  11/22/22 1138 11/23/22 2052   11/22/22 0615  ceFAZolin (ANCEF) IVPB 2g/100 mL premix        2 g 200 mL/hr over 30 Minutes Intravenous On call to O.R. 11/22/22 5284 11/22/22 0824     and started on DVT prophylaxis in the form of Xarelto.   PT and OT were ordered for total joint protocol. Discharge planning consulted to help with postop disposition and equipment  needs.  Patient had a fair night on the evening of surgery. They started to get up OOB with therapy on POD #0. Pt was seen during rounds and was ready to go home pending progress with therapy. She worked with therapy on POD #1 and was meeting her goals. Pt was discharged to home later that day in stable condition.  Diet: Regular diet Activity: WBAT Follow-up: in 2 weeks Disposition: Home Discharged Condition: stable   Discharge Instructions     Call MD / Call 911   Complete by: As directed    If you experience chest pain or shortness of breath, CALL 911 and be transported to the hospital emergency room.  If you develope a fever above 101 F, pus (white drainage) or increased drainage or redness at the wound, or calf pain, call your surgeon's office.   Change dressing   Complete by: As directed    You may remove the bulky bandage (ACE wrap and gauze) two days after surgery. You will have an adhesive waterproof bandage underneath. Leave this in place until your first follow-up appointment.   Constipation Prevention   Complete by: As directed    Drink plenty of fluids.  Prune juice may be helpful.  You may use a stool softener, such as Colace (over the counter) 100 mg twice a day.  Use MiraLax (over the counter) for constipation as needed.   Diet - low sodium heart healthy   Complete by: As directed    Do not put a pillow under the knee. Place it under the heel.   Complete by: As directed    Driving restrictions   Complete by: As directed    No driving for two weeks   Post-operative opioid taper instructions:   Complete by: As  directed    POST-OPERATIVE OPIOID TAPER INSTRUCTIONS: It is important to wean off of your opioid medication as soon as possible. If you do not need pain medication after your surgery it is ok to stop day one. Opioids include: Codeine, Hydrocodone(Norco, Vicodin), Oxycodone(Percocet, oxycontin) and hydromorphone amongst others.  Long term and even short term use of opiods can cause: Increased pain response Dependence Constipation Depression Respiratory depression And more.  Withdrawal symptoms can include Flu like symptoms Nausea, vomiting And more Techniques to manage these symptoms Hydrate well Eat regular healthy meals Stay active Use relaxation techniques(deep breathing, meditating, yoga) Do Not substitute Alcohol to help with tapering If you have been on opioids for less than two weeks and do not have pain than it is ok to stop all together.  Plan to wean off of opioids This plan should start within one week post op of your joint replacement. Maintain the same interval or time between taking each dose and first decrease the dose.  Cut the total daily intake of opioids by one tablet each day Next start to increase the time between doses. The last dose that should be eliminated is the evening dose.      TED hose   Complete by: As directed    Use stockings (TED hose) for three weeks on both leg(s).  You may remove them at night for sleeping.   Weight bearing as tolerated   Complete by: As directed       Allergies as of 11/23/2022   No Known Allergies      Medication List     STOP taking these medications    celecoxib 200 MG capsule Commonly known as: CELEBREX  TAKE these medications    ALPRAZolam 0.5 MG tablet Commonly known as: XANAX Take 0.5 mg by mouth at bedtime as needed for sleep.   anastrozole 1 MG tablet Commonly known as: ARIMIDEX Take 1 tablet (1 mg total) by mouth daily.   atorvastatin 10 MG tablet Commonly known as: LIPITOR Take 10  mg by mouth daily.   calcium carbonate 500 MG chewable tablet Commonly known as: TUMS - dosed in mg elemental calcium Chew 1 tablet by mouth daily as needed for indigestion or heartburn.   levothyroxine 137 MCG tablet Commonly known as: SYNTHROID TAKE 1 TABLET BY MOUTH EVERY DAY   methocarbamol 500 MG tablet Commonly known as: ROBAXIN Take 1 tablet (500 mg total) by mouth every 6 (six) hours as needed for muscle spasms.   minocycline 100 MG capsule Commonly known as: MINOCIN Take 100 mg by mouth daily as needed (Rosacea).   oxyCODONE 5 MG immediate release tablet Commonly known as: Oxy IR/ROXICODONE Take 1-2 tablets (5-10 mg total) by mouth every 6 (six) hours as needed for severe pain.   rivaroxaban 10 MG Tabs tablet Commonly known as: XARELTO Take 1 tablet (10 mg total) by mouth daily with breakfast for 20 days. Then take one 81 mg aspirin once a day for three weeks. Then discontinue aspirin.   traMADol 50 MG tablet Commonly known as: ULTRAM Take 1-2 tablets (50-100 mg total) by mouth every 6 (six) hours as needed for moderate pain.               Discharge Care Instructions  (From admission, onward)           Start     Ordered   11/23/22 0000  Weight bearing as tolerated        11/23/22 0731   11/23/22 0000  Change dressing       Comments: You may remove the bulky bandage (ACE wrap and gauze) two days after surgery. You will have an adhesive waterproof bandage underneath. Leave this in place until your first follow-up appointment.   11/23/22 0731            Follow-up Information     Ollen Gross, MD. Schedule an appointment as soon as possible for a visit in 2 week(s).   Specialty: Orthopedic Surgery Contact information: [address] [place] [address] [phone]                 Signed: R. Arcola Jansky, PA-C Orthopedic Surgery 11/24/2022, 8:23 AM

## 2022-11-25 DIAGNOSIS — M25561 Pain in right knee: Secondary | ICD-10-CM | POA: Diagnosis not present

## 2022-11-25 DIAGNOSIS — M25661 Stiffness of right knee, not elsewhere classified: Secondary | ICD-10-CM | POA: Diagnosis not present

## 2022-12-10 DIAGNOSIS — M25661 Stiffness of right knee, not elsewhere classified: Secondary | ICD-10-CM | POA: Diagnosis not present

## 2022-12-10 DIAGNOSIS — M25561 Pain in right knee: Secondary | ICD-10-CM | POA: Diagnosis not present

## 2022-12-29 DIAGNOSIS — Z5189 Encounter for other specified aftercare: Secondary | ICD-10-CM | POA: Diagnosis not present

## 2023-01-26 ENCOUNTER — Telehealth: Payer: Self-pay | Admitting: Hematology and Oncology

## 2023-02-14 ENCOUNTER — Ambulatory Visit: Payer: BC Managed Care – PPO | Admitting: Hematology and Oncology

## 2023-02-14 ENCOUNTER — Other Ambulatory Visit: Payer: BC Managed Care – PPO

## 2023-02-22 ENCOUNTER — Other Ambulatory Visit: Payer: Self-pay | Admitting: Hematology and Oncology

## 2023-02-22 ENCOUNTER — Inpatient Hospital Stay: Payer: BC Managed Care – PPO | Attending: Hematology and Oncology

## 2023-02-22 ENCOUNTER — Inpatient Hospital Stay: Payer: BC Managed Care – PPO | Admitting: Hematology and Oncology

## 2023-02-22 VITALS — BP 139/87 | HR 86 | Temp 98.1°F | Resp 16 | Wt 227.6 lb

## 2023-02-22 DIAGNOSIS — R911 Solitary pulmonary nodule: Secondary | ICD-10-CM | POA: Diagnosis not present

## 2023-02-22 DIAGNOSIS — C50412 Malignant neoplasm of upper-outer quadrant of left female breast: Secondary | ICD-10-CM | POA: Insufficient documentation

## 2023-02-22 DIAGNOSIS — Z800 Family history of malignant neoplasm of digestive organs: Secondary | ICD-10-CM | POA: Diagnosis not present

## 2023-02-22 DIAGNOSIS — Z170 Estrogen receptor positive status [ER+]: Secondary | ICD-10-CM | POA: Insufficient documentation

## 2023-02-22 DIAGNOSIS — Z79811 Long term (current) use of aromatase inhibitors: Secondary | ICD-10-CM | POA: Diagnosis not present

## 2023-02-22 DIAGNOSIS — Z87891 Personal history of nicotine dependence: Secondary | ICD-10-CM | POA: Diagnosis not present

## 2023-02-22 DIAGNOSIS — Z923 Personal history of irradiation: Secondary | ICD-10-CM | POA: Diagnosis not present

## 2023-02-22 DIAGNOSIS — Z8051 Family history of malignant neoplasm of kidney: Secondary | ICD-10-CM | POA: Diagnosis not present

## 2023-02-22 LAB — CBC WITH DIFFERENTIAL (CANCER CENTER ONLY)
Abs Immature Granulocytes: 0.02 10*3/uL (ref 0.00–0.07)
Basophils Absolute: 0.1 10*3/uL (ref 0.0–0.1)
Basophils Relative: 1 %
Eosinophils Absolute: 0.3 10*3/uL (ref 0.0–0.5)
Eosinophils Relative: 4 %
HCT: 40.1 % (ref 36.0–46.0)
Hemoglobin: 13.3 g/dL (ref 12.0–15.0)
Immature Granulocytes: 0 %
Lymphocytes Relative: 31 %
Lymphs Abs: 2.6 10*3/uL (ref 0.7–4.0)
MCH: 28.5 pg (ref 26.0–34.0)
MCHC: 33.2 g/dL (ref 30.0–36.0)
MCV: 85.9 fL (ref 80.0–100.0)
Monocytes Absolute: 0.5 10*3/uL (ref 0.1–1.0)
Monocytes Relative: 6 %
Neutro Abs: 4.9 10*3/uL (ref 1.7–7.7)
Neutrophils Relative %: 58 %
Platelet Count: 291 10*3/uL (ref 150–400)
RBC: 4.67 MIL/uL (ref 3.87–5.11)
RDW: 13.8 % (ref 11.5–15.5)
WBC Count: 8.3 10*3/uL (ref 4.0–10.5)
nRBC: 0 % (ref 0.0–0.2)

## 2023-02-22 LAB — CMP (CANCER CENTER ONLY)
ALT: 15 U/L (ref 0–44)
AST: 18 U/L (ref 15–41)
Albumin: 3.6 g/dL (ref 3.5–5.0)
Alkaline Phosphatase: 101 U/L (ref 38–126)
Anion gap: 5 (ref 5–15)
BUN: 15 mg/dL (ref 8–23)
CO2: 27 mmol/L (ref 22–32)
Calcium: 8.7 mg/dL — ABNORMAL LOW (ref 8.9–10.3)
Chloride: 107 mmol/L (ref 98–111)
Creatinine: 0.76 mg/dL (ref 0.44–1.00)
GFR, Estimated: >60 mL/min (ref >60)
Glucose, Bld: 110 mg/dL — ABNORMAL HIGH (ref 70–99)
Potassium: 3.8 mmol/L (ref 3.5–5.1)
Sodium: 139 mmol/L (ref 135–145)
Total Bilirubin: 0.4 mg/dL (ref 0.3–1.2)
Total Protein: 6.6 g/dL (ref 6.5–8.1)

## 2023-02-22 NOTE — Progress Notes (Signed)
[facility] [phone]   [phone]  PROGRESS NOTE  Patient Care Team: Creola Corn, MD as PCP - General (Internal Medicine) Abigail Miyamoto, MD as Consulting Physician (General Surgery) Lonie Peak, MD as Consulting Physician (Radiation Oncology) Pershing Proud, RN as Oncology Nurse Navigator Donnelly Angelica, RN as Oncology Nurse Navigator Gerald Leitz, MD as Consulting Physician (Obstetrics and Gynecology) Jaci Standard, MD as Consulting Physician (Hematology and Oncology)  Hematological/Oncological History #Bilateral ER/PR+ Breast Cancer 1) status post right lumpectomy and sentinel lymph node biopsy 05/13/2011 for a T1c N0, stage IA invasive ductal carcinoma, grade 1, estrogen receptor 69% and progesterone receptor 84% positive, with an MIB-1 of 6%, and no HER-2 amplification.              (a) completed adjuvant radiation January 2013              (b) tamoxifen started January 2013 and taken irregularly for up to 3 years per patient recollection   (2) status post left breast upper outer quadrant biopsy 06/06/2019 for a clinical T1a N0, stage IA's of ductal carcinoma, grade 1, estrogen and progesterone receptor positive, HER-2 nonamplified, with an MIB-1 of 10%   (3) genetics testing pending   (4) status post left lumpectomy and sentinel lymph node sampling 07/12/2019 for a pT1b pN0, stage IA invasive ductal carcinoma, grade 1, with negative margins             (a) the single sentinel lymph node removed was negative   (5) adjuvant radiation 08/13/2019 through 09/10/2019  (6) started anastrozole 10/04/2019             (a) dose cut to 1/2 tablet daily 10/22/2019, and poor tolerance   (7) chest CT scan 08/09/2019 showed a 0.6 cm right middle lobe pulmonary nodule stable as compared to August 2019, 63-month follow-up CT without contrast suggested  (8) 02/12/2022: transition care to Dr. Leonides Schanz due to Dr. Darrall Dears retiring.   Interval History:   Kristen Gibson 67 y.o. female with medical history significant for bilateral ER/PR+ breast cancer who presents for a follow up visit. The patient's last visit was on 02/12/2022. In the interim since the last visit she underwent a total knee surgery back in May 2024.  She reports that she feels like she is back to 95% since that procedure.  On exam today Kristen Gibson reports that she has had no recent illnesses.  She has had no runny nose, sore throat, or cough.  She reports that she is due for her mammogram next week and is up-to-date with her colonoscopy having had it performed earlier this year.  She reports that she has good energy and a strong appetite.  After her knee surgery she did lose some weight, and is down to 227 pounds, from 240 pounds.  She reports that she was on medication that she "hated" including oxycodone and antispasmodic.  Otherwise she is at her baseline level of health.  She continues to perform monthly self breast exams.  She has not noticed any abnormalities on self breast exam.  She denies any fevers, chills, sweats, nausea, vomiting or diarrhea.  A full 10 point ROS is otherwise negative.  Full 10 point ROS is listed below.  MEDICAL HISTORY:  Past Medical History:  Diagnosis Date   Allergy    Anxiety    no medications for anxiety   Arthritis    Breast cancer (HCC)    2012   Cancer (  HCC) 05/2011   right breast   GERD (gastroesophageal reflux disease)    Hypothyroid    Personal history of radiation therapy    2012 right breast   Personal history of radiation therapy    2021 left breast   PONV (postoperative nausea and vomiting)    with first breast surgery   Psoriasis    S/P radiation therapy 06/17/2011 - 08/04/2011   Right Breast - 45 Gy/25 fractions whole breast, and 16 Gy/8 fractions for boost    SURGICAL HISTORY: Past Surgical History:  Procedure Laterality Date   BREAST LUMPECTOMY Right    2012   BREAST LUMPECTOMY Left    2021   BREAST LUMPECTOMY  WITH RADIOACTIVE SEED AND SENTINEL LYMPH NODE BIOPSY Left 07/12/2019   Procedure: LEFT BREAST LUMPECTOMY WITH RADIOACTIVE SEED AND SENTINEL LYMPH NODE BIOPSY;  Surgeon: Abigail Miyamoto, MD;  Location: [facility];  Service: General;  Laterality: Left;   BREAST SURGERY  05/13/2011   Rt Breast Lumpectomy with sentinel node biospy, invasive ductal ca, 0/2 nodes    COLONOSCOPY     EYE SURGERY     Lasix   POLYPECTOMY     TONSILLECTOMY     TOTAL KNEE ARTHROPLASTY Right 11/22/2022   Procedure: TOTAL KNEE ARTHROPLASTY;  Surgeon: Ollen Gross, MD;  Location: [facility];  Service: Orthopedics;  Laterality: Right;   TUBAL LIGATION     At age 15    SOCIAL HISTORY: Social History   Socioeconomic History   Marital status: Divorced    Spouse name: Not on file   Number of children: Not on file   Years of education: Not on file   Highest education level: Not on file  Occupational History   Occupation: insurance adm  Tobacco Use   Smoking status: Former    Current packs/day: 0.00    Average packs/day: 0.3 packs/day for 30.0 years (9.0 ttl pk-yrs)    Types: Cigarettes    Start date: 05/10/1979    Quit date: 05/09/2009    Years since quitting: 13.8   Smokeless tobacco: Never  Vaping Use   Vaping status: Never Used  Substance and Sexual Activity   Alcohol use: No   Drug use: No   Sexual activity: Never    Comment: menarche age 72 Gx, P0, perimenopsusal prior to tx, No BC  Other Topics Concern   Not on file  Social History Narrative   No children   Social Determinants of Health   Financial Resource Strain: Not on file  Food Insecurity: No Food Insecurity (11/22/2022)   Hunger Vital Sign    Worried About Running Out of Food in the Last Year: Never true    Ran Out of Food in the Last Year: Never true  Transportation Needs: No Transportation Needs (11/22/2022)   PRAPARE - Administrator, Civil Service (Medical): No    Lack of Transportation (Non-Medical): No   Physical Activity: Not on file  Stress: Not on file  Social Connections: Not on file  Intimate Partner Violence: Not At Risk (11/22/2022)   Humiliation, Afraid, Rape, and Kick questionnaire    Fear of Current or Ex-Partner: No    Emotionally Abused: No    Physically Abused: No    Sexually Abused: No    FAMILY HISTORY: Family History  Problem Relation Age of Onset   Cancer Mother        kidney   Kidney cancer Mother    Cancer Maternal Grandmother  colon   Colon cancer Maternal Grandmother    Cancer Paternal Grandmother        colon   Colon cancer Paternal Grandmother    Diabetes Neg Hx    Rectal cancer Neg Hx    Stomach cancer Neg Hx    Colon polyps Neg Hx     ALLERGIES:  has No Known Allergies.  MEDICATIONS:  Current Outpatient Medications  Medication Sig Dispense Refill   ALPRAZolam (XANAX) 0.5 MG tablet Take 0.5 mg by mouth at bedtime as needed for sleep.     anastrozole (ARIMIDEX) 1 MG tablet Take 1 tablet (1 mg total) by mouth daily. 90 tablet 3   atorvastatin (LIPITOR) 10 MG tablet Take 10 mg by mouth daily.     calcium carbonate (TUMS - DOSED IN MG ELEMENTAL CALCIUM) 500 MG chewable tablet Chew 1 tablet by mouth daily as needed for indigestion or heartburn.     levothyroxine (SYNTHROID, LEVOTHROID) 137 MCG tablet TAKE 1 TABLET BY MOUTH EVERY DAY 90 tablet 0   methocarbamol (ROBAXIN) 500 MG tablet Take 1 tablet (500 mg total) by mouth every 6 (six) hours as needed for muscle spasms. 40 tablet 0   minocycline (MINOCIN) 100 MG capsule Take 100 mg by mouth daily as needed (Rosacea).     oxyCODONE (OXY IR/ROXICODONE) 5 MG immediate release tablet Take 1-2 tablets (5-10 mg total) by mouth every 6 (six) hours as needed for severe pain. 42 tablet 0   traMADol (ULTRAM) 50 MG tablet Take 1-2 tablets (50-100 mg total) by mouth every 6 (six) hours as needed for moderate pain. 40 tablet 0   No current facility-administered medications for this visit.    REVIEW OF  SYSTEMS:   Constitutional: ( - ) fevers, ( - )  chills , ( - ) night sweats Eyes: ( - ) blurriness of vision, ( - ) double vision, ( - ) watery eyes Ears, nose, mouth, throat, and face: ( - ) mucositis, ( - ) sore throat Respiratory: ( - ) cough, ( - ) dyspnea, ( - ) wheezes Cardiovascular: ( - ) palpitation, ( - ) chest discomfort, ( - ) lower extremity swelling Gastrointestinal:  ( - ) nausea, ( - ) heartburn, ( - ) change in bowel habits Skin: ( - ) abnormal skin rashes Lymphatics: ( - ) new lymphadenopathy, ( - ) easy bruising Neurological: ( - ) numbness, ( - ) tingling, ( - ) new weaknesses Behavioral/Psych: ( - ) mood change, ( - ) new changes  All other systems were reviewed with the patient and are negative.  PHYSICAL EXAMINATION:  Vitals:   02/22/23 0919  BP: 139/87  Pulse: 86  Resp: 16  Temp: 98.1 F (36.7 C)  SpO2: 97%    Filed Weights   02/22/23 0919  Weight: 227 lb 9.6 oz (103.2 kg)     GENERAL: Well-appearing middle-aged Caucasian female, alert, no distress and comfortable SKIN: skin color, texture, turgor are normal, no rashes or significant lesions EYES: conjunctiva are pink and non-injected, sclera clear BREAST: deferred per patient request. Mammogram upcoming this month.  LUNGS: clear to auscultation and percussion with normal breathing effort HEART: regular rate & rhythm and no murmurs and no lower extremity edema ABDOMEN: soft, non-tender, non-distended, normal bowel sounds Musculoskeletal: no cyanosis of digits and no clubbing  PSYCH: alert & oriented x 3, fluent speech NEURO: no focal motor/sensory deficits  LABORATORY DATA:  I have reviewed the data as listed    Latest  Ref Rng & Units 02/22/2023    8:54 AM 11/23/2022    3:44 AM 11/22/2022    7:49 AM  CBC  WBC 4.0 - 10.5 K/uL 8.3  23.7    Hemoglobin 12.0 - 15.0 g/dL 60.4  54.0  98.1   Hematocrit 36.0 - 46.0 % 40.1  36.0  36.0   Platelets 150 - 400 K/uL 291  265         Latest Ref Rng &  Units 02/22/2023    8:54 AM 11/23/2022    3:44 AM 11/22/2022    7:49 AM  CMP  Glucose 70 - 99 mg/dL 191  478  93   BUN 8 - 23 mg/dL 15  15  11    Creatinine 0.44 - 1.00 mg/dL 2.95  6.21  3.08   Sodium 135 - 145 mmol/L 139  135  139   Potassium 3.5 - 5.1 mmol/L 3.8  4.3  3.6   Chloride 98 - 111 mmol/L 107  104  106   CO2 22 - 32 mmol/L 27  25    Calcium 8.9 - 10.3 mg/dL 8.7  8.7    Total Protein 6.5 - 8.1 g/dL 6.6     Total Bilirubin 0.3 - 1.2 mg/dL 0.4     Alkaline Phos 38 - 126 U/L 101     AST 15 - 41 U/L 18     ALT 0 - 44 U/L 15       RADIOGRAPHIC STUDIES: No results found.  ASSESSMENT & PLAN Kristen Gibson 67 y.o. female with medical history significant for bilateral ER/PR+ breast cancer who presents for a follow up visit.   After review the labs, review the records, discussion with the patient the findings are most consistent with bilateral ER/PR positive breast cancer occurring in the breast bilaterally.  These occurred at different times.  She is currently on anastrozole therapy and we will plan to continue it for 7 to 10 years.  Will plan to continue this therapy until at least 2028.  At this time would recommend continued annual mammograms.  Patient does perform regular self exams every 1 to 2 months.  We are happy to see the patient back sooner.  Patient would like to continue annual visits.  #Bilateral ER/PR+ Breast Cancer --currently on anastrazole 0.5 mg PO daily (1/2 tablet). Plan to continue for at least 7 years (2028)  --mammogram scheduled for 03/03/2023 --CT Chest WO on 03/10/2022 to assess previously noted lung nodule (6 mm in 2021). Repeat CT scan recommended per radiology (due now).  --labs today show white blood cell 8.3, hemoglobin 13.3, MCV 85.9, and platelets of 291 --RTC in 12 months time to re-evaluate.   Orders Placed This Encounter  Procedures   CT CHEST WO CONTRAST    Standing Status:   Future    Standing Expiration Date:   02/22/2024    Order Specific  Question:   Preferred imaging location?    Answer:   [facility]    All questions were answered. The patient knows to call the clinic with any problems, questions or concerns.  A total of more than 30 minutes were spent on this encounter with face-to-face time and non-face-to-face time, including preparing to see the patient, ordering tests and/or medications, counseling the patient and coordination of care as outlined above.   Ulysees Barns, MD Department of Hematology/Oncology [facility] Phone: [phone] Pager: [phone] Email: [email or web address]  02/22/2023 9:51 AM

## 2023-03-03 ENCOUNTER — Ambulatory Visit
Admission: RE | Admit: 2023-03-03 | Discharge: 2023-03-03 | Disposition: A | Discharge status: Home / Self Care | Payer: BC Managed Care – PPO | Source: Ambulatory Visit | Attending: Internal Medicine | Admitting: Internal Medicine

## 2023-03-03 DIAGNOSIS — Z1231 Encounter for screening mammogram for malignant neoplasm of breast: Secondary | ICD-10-CM | POA: Diagnosis not present

## 2023-03-08 ENCOUNTER — Other Ambulatory Visit: Payer: Self-pay | Admitting: Hematology and Oncology

## 2023-08-22 DIAGNOSIS — M8580 Other specified disorders of bone density and structure, unspecified site: Secondary | ICD-10-CM | POA: Diagnosis not present

## 2023-08-22 DIAGNOSIS — E039 Hypothyroidism, unspecified: Secondary | ICD-10-CM | POA: Diagnosis not present

## 2023-08-22 DIAGNOSIS — E785 Hyperlipidemia, unspecified: Secondary | ICD-10-CM | POA: Diagnosis not present

## 2023-08-22 DIAGNOSIS — R739 Hyperglycemia, unspecified: Secondary | ICD-10-CM | POA: Diagnosis not present

## 2023-08-29 DIAGNOSIS — R82998 Other abnormal findings in urine: Secondary | ICD-10-CM | POA: Diagnosis not present

## 2023-08-29 DIAGNOSIS — Z0000 Encounter for general adult medical examination without abnormal findings: Secondary | ICD-10-CM | POA: Diagnosis not present

## 2023-08-29 DIAGNOSIS — Z1331 Encounter for screening for depression: Secondary | ICD-10-CM | POA: Diagnosis not present

## 2023-08-29 DIAGNOSIS — E039 Hypothyroidism, unspecified: Secondary | ICD-10-CM | POA: Diagnosis not present

## 2023-09-05 DIAGNOSIS — M25562 Pain in left knee: Secondary | ICD-10-CM | POA: Diagnosis not present

## 2023-09-15 ENCOUNTER — Other Ambulatory Visit: Payer: Self-pay | Admitting: Internal Medicine

## 2023-09-15 DIAGNOSIS — Z1231 Encounter for screening mammogram for malignant neoplasm of breast: Secondary | ICD-10-CM

## 2023-09-23 ENCOUNTER — Other Ambulatory Visit: Payer: Self-pay | Admitting: Internal Medicine

## 2023-09-23 DIAGNOSIS — R911 Solitary pulmonary nodule: Secondary | ICD-10-CM

## 2023-10-21 ENCOUNTER — Other Ambulatory Visit

## 2023-10-25 ENCOUNTER — Other Ambulatory Visit: Payer: Self-pay | Admitting: Internal Medicine

## 2023-10-25 DIAGNOSIS — R911 Solitary pulmonary nodule: Secondary | ICD-10-CM

## 2023-10-27 ENCOUNTER — Encounter (HOSPITAL_COMMUNITY): Payer: Self-pay

## 2023-10-27 ENCOUNTER — Emergency Department (HOSPITAL_COMMUNITY)
Admission: EM | Admit: 2023-10-27 | Discharge: 2023-11-03 | Disposition: E | Attending: Emergency Medicine | Admitting: Emergency Medicine

## 2023-10-27 DIAGNOSIS — I469 Cardiac arrest, cause unspecified: Secondary | ICD-10-CM

## 2023-10-27 DIAGNOSIS — S99911A Unspecified injury of right ankle, initial encounter: Secondary | ICD-10-CM | POA: Diagnosis not present

## 2023-10-27 DIAGNOSIS — Y92410 Unspecified street and highway as the place of occurrence of the external cause: Secondary | ICD-10-CM | POA: Insufficient documentation

## 2023-10-27 DIAGNOSIS — S82891B Other fracture of right lower leg, initial encounter for open fracture type I or II: Secondary | ICD-10-CM | POA: Insufficient documentation

## 2023-10-28 ENCOUNTER — Encounter (HOSPITAL_COMMUNITY): Payer: Self-pay | Admitting: Orthopedic Surgery
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
## 2023-11-03 NOTE — ED Notes (Signed)
 1096- CPR started via EMS 4 EPIS given via EMS  0710- EPI given by ED staff  [phone]- pulse check  [phone]- CPR continued  [phone]- TIme of death

## 2023-11-03 NOTE — ED Notes (Signed)
 Dr. Janee Morn at bedside.
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
## 2023-11-03 DEATH — deceased

## 2023-12-26 ENCOUNTER — Ambulatory Visit (HOSPITAL_COMMUNITY): Admit: 2023-12-26 | Admitting: Orthopedic Surgery

## 2023-12-26 SURGERY — ARTHROPLASTY, KNEE, TOTAL
Anesthesia: Choice | Site: Knee | Laterality: Left

## 2024-02-22 ENCOUNTER — Ambulatory Visit: Payer: BC Managed Care – PPO | Admitting: Hematology and Oncology

## 2024-02-22 ENCOUNTER — Other Ambulatory Visit: Payer: BC Managed Care – PPO

## 2024-03-06 ENCOUNTER — Ambulatory Visit
